# Patient Record
Sex: Female | Born: 1950 | Race: White | Hispanic: No | Marital: Married | Smoking: Never smoker
Health system: Southern US, Community
[De-identification: ages and names within clinical notes are randomized; demographics above are authoritative.]

## PROBLEM LIST (undated history)

## (undated) DIAGNOSIS — F329 Major depressive disorder, single episode, unspecified: Secondary | ICD-10-CM

## (undated) DIAGNOSIS — K219 Gastro-esophageal reflux disease without esophagitis: Secondary | ICD-10-CM

## (undated) DIAGNOSIS — R112 Nausea with vomiting, unspecified: Secondary | ICD-10-CM

## (undated) DIAGNOSIS — Z9889 Other specified postprocedural states: Secondary | ICD-10-CM

## (undated) DIAGNOSIS — K589 Irritable bowel syndrome without diarrhea: Secondary | ICD-10-CM

## (undated) DIAGNOSIS — M1990 Unspecified osteoarthritis, unspecified site: Secondary | ICD-10-CM

## (undated) DIAGNOSIS — R32 Unspecified urinary incontinence: Secondary | ICD-10-CM

## (undated) DIAGNOSIS — R5382 Chronic fatigue, unspecified: Secondary | ICD-10-CM

## (undated) DIAGNOSIS — C679 Malignant neoplasm of bladder, unspecified: Secondary | ICD-10-CM

## (undated) DIAGNOSIS — Q059 Spina bifida, unspecified: Secondary | ICD-10-CM

## (undated) DIAGNOSIS — M797 Fibromyalgia: Secondary | ICD-10-CM

## (undated) DIAGNOSIS — Z8719 Personal history of other diseases of the digestive system: Secondary | ICD-10-CM

## (undated) DIAGNOSIS — E039 Hypothyroidism, unspecified: Secondary | ICD-10-CM

## (undated) DIAGNOSIS — F419 Anxiety disorder, unspecified: Secondary | ICD-10-CM

## (undated) DIAGNOSIS — F32A Depression, unspecified: Secondary | ICD-10-CM

## (undated) HISTORY — PX: EYE SURGERY: SHX253

## (undated) HISTORY — PX: ABDOMINAL HYSTERECTOMY: SHX81

## (undated) HISTORY — PX: FOOT SURGERY: SHX648

## (undated) HISTORY — PX: COLONOSCOPY: SHX174

## (undated) HISTORY — PX: OTHER SURGICAL HISTORY: SHX169

## (undated) HISTORY — PX: CHOLECYSTECTOMY: SHX55

## (undated) HISTORY — PX: ESOPHAGOGASTRODUODENOSCOPY: SHX1529

## (undated) HISTORY — PX: APPENDECTOMY: SHX54

## (undated) HISTORY — PX: ANKLE SURGERY: SHX546

---

## 2000-05-27 ENCOUNTER — Encounter (HOSPITAL_COMMUNITY): Admission: RE | Admit: 2000-05-27 | Discharge: 2000-08-25 | Payer: Self-pay | Admitting: Family Medicine

## 2000-08-02 ENCOUNTER — Encounter: Payer: Self-pay | Admitting: Family Medicine

## 2001-02-16 ENCOUNTER — Emergency Department (HOSPITAL_COMMUNITY): Admission: EM | Admit: 2001-02-16 | Discharge: 2001-02-17 | Payer: Self-pay

## 2001-02-19 ENCOUNTER — Other Ambulatory Visit (HOSPITAL_COMMUNITY): Admission: RE | Admit: 2001-02-19 | Discharge: 2001-03-03 | Payer: Self-pay | Admitting: Psychiatry

## 2011-03-01 ENCOUNTER — Emergency Department (HOSPITAL_COMMUNITY)
Admission: EM | Admit: 2011-03-01 | Discharge: 2011-03-02 | Discharge status: Against Medical Advice | Payer: Medicare Other | Attending: Emergency Medicine | Admitting: Emergency Medicine

## 2011-03-01 DIAGNOSIS — R5381 Other malaise: Secondary | ICD-10-CM | POA: Insufficient documentation

## 2011-03-01 DIAGNOSIS — R42 Dizziness and giddiness: Secondary | ICD-10-CM | POA: Insufficient documentation

## 2014-03-25 ENCOUNTER — Emergency Department (HOSPITAL_COMMUNITY)
Admission: EM | Admit: 2014-03-25 | Discharge: 2014-03-25 | Discharge status: Against Medical Advice | Payer: Medicare Other | Attending: Emergency Medicine | Admitting: Emergency Medicine

## 2014-03-25 ENCOUNTER — Emergency Department (HOSPITAL_COMMUNITY): Payer: Medicare Other

## 2014-03-25 ENCOUNTER — Encounter (HOSPITAL_COMMUNITY): Payer: Self-pay | Admitting: Emergency Medicine

## 2014-03-25 DIAGNOSIS — S99919A Unspecified injury of unspecified ankle, initial encounter: Principal | ICD-10-CM

## 2014-03-25 DIAGNOSIS — Y92009 Unspecified place in unspecified non-institutional (private) residence as the place of occurrence of the external cause: Secondary | ICD-10-CM | POA: Insufficient documentation

## 2014-03-25 DIAGNOSIS — R296 Repeated falls: Secondary | ICD-10-CM | POA: Insufficient documentation

## 2014-03-25 DIAGNOSIS — Y9389 Activity, other specified: Secondary | ICD-10-CM | POA: Insufficient documentation

## 2014-03-25 DIAGNOSIS — S99929A Unspecified injury of unspecified foot, initial encounter: Principal | ICD-10-CM

## 2014-03-25 DIAGNOSIS — S8990XA Unspecified injury of unspecified lower leg, initial encounter: Secondary | ICD-10-CM | POA: Insufficient documentation

## 2014-03-25 HISTORY — DX: Fibromyalgia: M79.7

## 2014-03-25 HISTORY — DX: Chronic fatigue, unspecified: R53.82

## 2014-03-25 LAB — CBC WITH DIFFERENTIAL/PLATELET
Basophils Absolute: 0 10*3/uL (ref 0.0–0.1)
Basophils Relative: 0 % (ref 0–1)
Eosinophils Absolute: 0.2 10*3/uL (ref 0.0–0.7)
Eosinophils Relative: 3 % (ref 0–5)
HCT: 32.8 % — ABNORMAL LOW (ref 36.0–46.0)
Hemoglobin: 10.9 g/dL — ABNORMAL LOW (ref 12.0–15.0)
Lymphocytes Relative: 18 % (ref 12–46)
Lymphs Abs: 1.5 10*3/uL (ref 0.7–4.0)
MCH: 32.9 pg (ref 26.0–34.0)
MCHC: 33.2 g/dL (ref 30.0–36.0)
MCV: 99.1 fL (ref 78.0–100.0)
Monocytes Absolute: 0.7 10*3/uL (ref 0.1–1.0)
Monocytes Relative: 8 % (ref 3–12)
Neutro Abs: 5.9 10*3/uL (ref 1.7–7.7)
Neutrophils Relative %: 71 % (ref 43–77)
Platelets: 286 10*3/uL (ref 150–400)
RBC: 3.31 MIL/uL — ABNORMAL LOW (ref 3.87–5.11)
RDW: 14 % (ref 11.5–15.5)
WBC: 8.3 10*3/uL (ref 4.0–10.5)

## 2014-03-25 LAB — URINALYSIS, ROUTINE W REFLEX MICROSCOPIC
Bilirubin Urine: NEGATIVE
Glucose, UA: NEGATIVE mg/dL
Ketones, ur: NEGATIVE mg/dL
Nitrite: NEGATIVE
Protein, ur: NEGATIVE mg/dL
Specific Gravity, Urine: 1.015 (ref 1.005–1.030)
Urobilinogen, UA: 0.2 mg/dL (ref 0.0–1.0)
pH: 7 (ref 5.0–8.0)

## 2014-03-25 LAB — BASIC METABOLIC PANEL
Anion gap: 14 (ref 5–15)
BUN: 10 mg/dL (ref 6–23)
CO2: 24 mEq/L (ref 19–32)
Calcium: 8.7 mg/dL (ref 8.4–10.5)
Chloride: 90 mEq/L — ABNORMAL LOW (ref 96–112)
Creatinine, Ser: 0.49 mg/dL — ABNORMAL LOW (ref 0.50–1.10)
GFR calc Af Amer: >90 mL/min (ref >90)
GFR calc non Af Amer: >90 mL/min (ref >90)
Glucose, Bld: 114 mg/dL — ABNORMAL HIGH (ref 70–99)
Potassium: 4.8 mEq/L (ref 3.7–5.3)
Sodium: 128 mEq/L — ABNORMAL LOW (ref 137–147)

## 2014-03-25 LAB — URINE MICROSCOPIC-ADD ON

## 2014-03-25 NOTE — ED Notes (Signed)
Called x3 with no response

## 2014-03-25 NOTE — ED Notes (Signed)
Pt c/o wound to right ankle with some redness and swelling noted; pt with wound to bottom of right foot; pt appears sleepy at present; pt sts multiple falls unsure of why she is falling at home

## 2014-03-27 ENCOUNTER — Emergency Department (HOSPITAL_COMMUNITY): Payer: Medicare Other

## 2014-03-27 ENCOUNTER — Encounter (HOSPITAL_COMMUNITY): Payer: Self-pay | Admitting: Emergency Medicine

## 2014-03-27 ENCOUNTER — Inpatient Hospital Stay (HOSPITAL_COMMUNITY)
Admission: EM | Admit: 2014-03-27 | Discharge: 2014-03-28 | DRG: 603 | Disposition: A | Discharge status: Home / Self Care | Payer: Medicare Other | Attending: Internal Medicine | Admitting: Internal Medicine

## 2014-03-27 DIAGNOSIS — L03119 Cellulitis of unspecified part of limb: Principal | ICD-10-CM

## 2014-03-27 DIAGNOSIS — L0390 Cellulitis, unspecified: Secondary | ICD-10-CM | POA: Diagnosis present

## 2014-03-27 DIAGNOSIS — Z881 Allergy status to other antibiotic agents status: Secondary | ICD-10-CM

## 2014-03-27 DIAGNOSIS — M797 Fibromyalgia: Secondary | ICD-10-CM | POA: Diagnosis present

## 2014-03-27 DIAGNOSIS — R5382 Chronic fatigue, unspecified: Secondary | ICD-10-CM

## 2014-03-27 DIAGNOSIS — W19XXXA Unspecified fall, initial encounter: Secondary | ICD-10-CM | POA: Diagnosis present

## 2014-03-27 DIAGNOSIS — IMO0001 Reserved for inherently not codable concepts without codable children: Secondary | ICD-10-CM | POA: Diagnosis present

## 2014-03-27 DIAGNOSIS — Z880 Allergy status to penicillin: Secondary | ICD-10-CM

## 2014-03-27 DIAGNOSIS — Z79899 Other long term (current) drug therapy: Secondary | ICD-10-CM

## 2014-03-27 DIAGNOSIS — L02419 Cutaneous abscess of limb, unspecified: Secondary | ICD-10-CM | POA: Diagnosis not present

## 2014-03-27 DIAGNOSIS — L03115 Cellulitis of right lower limb: Secondary | ICD-10-CM

## 2014-03-27 LAB — BASIC METABOLIC PANEL
Anion gap: 15 (ref 5–15)
BUN: 10 mg/dL (ref 6–23)
CALCIUM: 8.6 mg/dL (ref 8.4–10.5)
CO2: 25 mEq/L (ref 19–32)
Chloride: 90 mEq/L — ABNORMAL LOW (ref 96–112)
Creatinine, Ser: 0.53 mg/dL (ref 0.50–1.10)
GFR calc Af Amer: >90 mL/min (ref >90)
GFR calc non Af Amer: >90 mL/min (ref >90)
GLUCOSE: 99 mg/dL (ref 70–99)
POTASSIUM: 4.7 meq/L (ref 3.7–5.3)
SODIUM: 130 meq/L — AB (ref 137–147)

## 2014-03-27 LAB — CBC
HCT: 31.9 % — ABNORMAL LOW (ref 36.0–46.0)
Hemoglobin: 10.4 g/dL — ABNORMAL LOW (ref 12.0–15.0)
MCH: 32.5 pg (ref 26.0–34.0)
MCHC: 32.6 g/dL (ref 30.0–36.0)
MCV: 99.7 fL (ref 78.0–100.0)
PLATELETS: 297 10*3/uL (ref 150–400)
RBC: 3.2 MIL/uL — AB (ref 3.87–5.11)
RDW: 14.3 % (ref 11.5–15.5)
WBC: 8.5 10*3/uL (ref 4.0–10.5)

## 2014-03-27 LAB — LACTIC ACID, PLASMA: Lactic Acid, Venous: 1.5 mmol/L (ref 0.5–2.2)

## 2014-03-27 MED ORDER — SODIUM CHLORIDE 0.9 % IV BOLUS (SEPSIS)
1000.0000 mL | Freq: Once | INTRAVENOUS | Status: AC
  Administered 2014-03-27: 1000 mL via INTRAVENOUS

## 2014-03-27 MED ORDER — CLINDAMYCIN PHOSPHATE 600 MG/50ML IV SOLN
600.0000 mg | Freq: Once | INTRAVENOUS | Status: AC
  Administered 2014-03-27: 600 mg via INTRAVENOUS
  Filled 2014-03-27: qty 50

## 2014-03-27 MED ORDER — ONDANSETRON HCL 4 MG/2ML IJ SOLN
4.0000 mg | Freq: Once | INTRAMUSCULAR | Status: AC
  Administered 2014-03-27: 4 mg via INTRAVENOUS
  Filled 2014-03-27: qty 2

## 2014-03-27 MED ORDER — MORPHINE SULFATE 4 MG/ML IJ SOLN
6.0000 mg | Freq: Once | INTRAMUSCULAR | Status: AC
  Administered 2014-03-27: 6 mg via INTRAVENOUS
  Filled 2014-03-27: qty 2

## 2014-03-27 NOTE — ED Notes (Signed)
Pt was in facility on Thursday with same complaint, left AMA d/t wait

## 2014-03-27 NOTE — ED Notes (Addendum)
Pt arrives via EMS with leg pain, sore on lateral side of R ankle, pus exudate according to EMS. Red streaking present. Pt states she fell last Friday, hit her head and ankle, states ankle was weeping at time of injury. Pt states ankle has been swollen since. Right leg is warmer to the touch than left leg, blanchable. Full movement. Pain started today 6/10

## 2014-03-27 NOTE — ED Notes (Signed)
MD Kohut at bedside. 

## 2014-03-27 NOTE — ED Provider Notes (Signed)
CSN: [id number]     Arrival date & time 03/27/14  2025 History   First MD Initiated Contact with Patient 03/27/14 2119     Chief Complaint  Patient presents with  . Leg Pain     (Consider location/radiation/quality/duration/timing/severity/associated sxs/prior Treatment) Patient is a 63 y.o. female presenting with leg pain.  Leg Pain Location:  Ankle Injury: yes   Mechanism of injury: fall   Fall:    Entrapped after fall: no   Ankle location:  R ankle Pain details:    Severity:  Moderate   Timing:  Constant Associated symptoms: no back pain     Past Medical History  Diagnosis Date  . Fibromyalgia   . Chronic fatigue    Past Surgical History  Procedure Laterality Date  . Ankle surgery     No family history on file. History  Substance Use Topics  . Smoking status: Never Smoker   . Smokeless tobacco: Not on file  . Alcohol Use: Yes   OB History   Grav Para Term Preterm Abortions TAB SAB Ect Mult Living                 Review of Systems  Constitutional: Negative for activity change.  HENT: Negative for congestion.   Respiratory: Negative for cough and shortness of breath.   Cardiovascular: Negative for chest pain and leg swelling.  Gastrointestinal: Negative for nausea, vomiting, abdominal pain, diarrhea, constipation, blood in stool and abdominal distention.  Genitourinary: Negative for dysuria, flank pain and vaginal discharge.  Musculoskeletal: Negative for back pain.  Skin: Positive for color change and wound.  Neurological: Negative for syncope and headaches.  Psychiatric/Behavioral: Negative for agitation.      Allergies  Penicillins and Sulfa antibiotics  Home Medications   Prior to Admission medications   Medication Sig Start Date End Date Taking? Authorizing Provider  alprazolam Duanne Moron) 2 MG tablet Take 2 mg by mouth 3 (three) times daily. scheduled 03/15/14  Yes Historical Provider, MD  buPROPion (WELLBUTRIN XL) 300 MG 24 hr tablet Take 300 mg  by mouth daily.  03/09/14  Yes Historical Provider, MD  carisoprodol (SOMA) 350 MG tablet Take 350 mg by mouth 3 (three) times daily.   Yes Historical Provider, MD  cephALEXin (KEFLEX) 500 MG capsule Take 500 mg by mouth 3 (three) times daily. 7 day course started 03/27/14 03/26/14  Yes Historical Provider, MD  estradiol (ESTRACE) 1 MG tablet Take 1 mg by mouth daily.  03/09/14  Yes Historical Provider, MD  FLUoxetine (PROZAC) 40 MG capsule Take 40 mg by mouth daily.  03/09/14  Yes Historical Provider, MD  gabapentin (NEURONTIN) 600 MG tablet Take 600 mg by mouth 3 (three) times daily.  03/09/14  Yes Historical Provider, MD  levothyroxine (SYNTHROID, LEVOTHROID) 50 MCG tablet Take 50 mcg by mouth daily.  03/09/14  Yes Historical Provider, MD  Multiple Vitamin (MULTIVITAMIN WITH MINERALS) TABS tablet Take 1 tablet by mouth daily.   Yes Historical Provider, MD  omeprazole (PRILOSEC) 40 MG capsule Take 40 mg by mouth 2 (two) times daily.  03/09/14  Yes Historical Provider, MD  Oxycodone HCl 10 MG TABS Take 10 mg by mouth 3 (three) times daily. scheduled 03/20/14  Yes Historical Provider, MD   BP 133/74  Pulse 91  Temp(Src) 98 F (36.7 C) (Oral)  Resp 16  SpO2 97% Physical Exam  Constitutional: She is oriented to person, place, and time. She appears well-developed.  HENT:  Head: Normocephalic.  Eyes: Pupils are equal,  round, and reactive to light.  Neck: Neck supple.  Cardiovascular: Normal rate.  Exam reveals no gallop and no friction rub.   No murmur heard. Pulmonary/Chest: Effort normal and breath sounds normal. No respiratory distress.  Abdominal: Soft. She exhibits no distension. There is no tenderness. There is no rebound.  Musculoskeletal: She exhibits no edema.  Wound to left foot, cellulitis noted on full calf   Neurological: She is alert and oriented to person, place, and time.  Skin: Skin is warm.  Psychiatric: She has a normal mood and affect.    ED Course  Procedures (including  critical care time) Labs Review Labs Reviewed - No data to display  Imaging Review Dg Ankle Complete Right  03/27/2014   CLINICAL DATA:  Fall 7 days ago  EXAM: RIGHT ANKLE - COMPLETE 3+ VIEW  COMPARISON:  None.  FINDINGS: There is no evidence of fracture, dislocation, or joint effusion. There is no evidence of arthropathy or other focal bone abnormality. Soft tissue swelling over the right lateral malleolus with a soft tissue laceration.  IMPRESSION: Soft tissue swelling over the right lateral malleolus with a soft tissue laceration. No acute osseous abnormality of the right ankle.   Electronically Signed   By: Kathreen Devoid   On: 03/27/2014 21:58     EKG Interpretation None      MDM   Final diagnoses:  None   63 year old female that presents with cellulitis of right leg. Patient did have a minor trauma which is suspected that she sustained infection that has now spread. Patient has tried outpatient management and was prescribed Keflex. Patient states that the infection has worsened since taking the Keflex. On physical exam the patient is noted to have cellulitis but pulse motor sensation is intact. Patient is able to have passive range of motion without significant pain. Unlikely septic joint. Patient does not show signs of systemic infection no fevers nausea vomiting. Patient is demonstrating that she is feeling outpatient management. Patient started on IV clindamycin in the department and admitted to Dr. Posey Pronto with [facility] for further management.    Claudean Severance, MD 03/29/14 [phone]

## 2014-03-28 DIAGNOSIS — L03119 Cellulitis of unspecified part of limb: Principal | ICD-10-CM

## 2014-03-28 DIAGNOSIS — Z79899 Other long term (current) drug therapy: Secondary | ICD-10-CM | POA: Diagnosis not present

## 2014-03-28 DIAGNOSIS — Z880 Allergy status to penicillin: Secondary | ICD-10-CM | POA: Diagnosis not present

## 2014-03-28 DIAGNOSIS — R5383 Other fatigue: Secondary | ICD-10-CM

## 2014-03-28 DIAGNOSIS — IMO0001 Reserved for inherently not codable concepts without codable children: Secondary | ICD-10-CM | POA: Diagnosis present

## 2014-03-28 DIAGNOSIS — L02419 Cutaneous abscess of limb, unspecified: Secondary | ICD-10-CM | POA: Diagnosis present

## 2014-03-28 DIAGNOSIS — M797 Fibromyalgia: Secondary | ICD-10-CM | POA: Diagnosis present

## 2014-03-28 DIAGNOSIS — W19XXXA Unspecified fall, initial encounter: Secondary | ICD-10-CM | POA: Diagnosis present

## 2014-03-28 DIAGNOSIS — M7989 Other specified soft tissue disorders: Secondary | ICD-10-CM

## 2014-03-28 DIAGNOSIS — R5381 Other malaise: Secondary | ICD-10-CM

## 2014-03-28 DIAGNOSIS — R5382 Chronic fatigue, unspecified: Secondary | ICD-10-CM | POA: Diagnosis present

## 2014-03-28 DIAGNOSIS — L0390 Cellulitis, unspecified: Secondary | ICD-10-CM | POA: Diagnosis present

## 2014-03-28 DIAGNOSIS — M79609 Pain in unspecified limb: Secondary | ICD-10-CM

## 2014-03-28 DIAGNOSIS — Z881 Allergy status to other antibiotic agents status: Secondary | ICD-10-CM | POA: Diagnosis not present

## 2014-03-28 LAB — COMPREHENSIVE METABOLIC PANEL
ALBUMIN: 3 g/dL — AB (ref 3.5–5.2)
ALT: 35 U/L (ref 0–35)
ANION GAP: 15 (ref 5–15)
AST: 58 U/L — ABNORMAL HIGH (ref 0–37)
Alkaline Phosphatase: 89 U/L (ref 39–117)
BUN: 8 mg/dL (ref 6–23)
CHLORIDE: 96 meq/L (ref 96–112)
CO2: 21 mEq/L (ref 19–32)
CREATININE: 0.51 mg/dL (ref 0.50–1.10)
Calcium: 8.1 mg/dL — ABNORMAL LOW (ref 8.4–10.5)
GFR calc Af Amer: >90 mL/min (ref >90)
Glucose, Bld: 87 mg/dL (ref 70–99)
Potassium: 4.4 mEq/L (ref 3.7–5.3)
Sodium: 132 mEq/L — ABNORMAL LOW (ref 137–147)
Total Bilirubin: <0.2 mg/dL — ABNORMAL LOW (ref 0.3–1.2)
Total Protein: 6.2 g/dL (ref 6.0–8.3)

## 2014-03-28 LAB — C-REACTIVE PROTEIN: CRP: 2.2 mg/dL — ABNORMAL HIGH (ref <0.60)

## 2014-03-28 LAB — CBC
HEMATOCRIT: 31.1 % — AB (ref 36.0–46.0)
Hemoglobin: 10.2 g/dL — ABNORMAL LOW (ref 12.0–15.0)
MCH: 33 pg (ref 26.0–34.0)
MCHC: 32.8 g/dL (ref 30.0–36.0)
MCV: 100.6 fL — ABNORMAL HIGH (ref 78.0–100.0)
PLATELETS: 238 10*3/uL (ref 150–400)
RBC: 3.09 MIL/uL — ABNORMAL LOW (ref 3.87–5.11)
RDW: 14.5 % (ref 11.5–15.5)
WBC: 6.5 10*3/uL (ref 4.0–10.5)

## 2014-03-28 LAB — PROTIME-INR
INR: 0.99 (ref 0.00–1.49)
PROTHROMBIN TIME: 13.1 s (ref 11.6–15.2)

## 2014-03-28 LAB — SEDIMENTATION RATE: SED RATE: 37 mm/h — AB (ref 0–22)

## 2014-03-28 MED ORDER — ONDANSETRON HCL 4 MG PO TABS: 4.0000 mg | ORAL_TABLET | Freq: Four times a day (QID) | ORAL | Status: DC | PRN

## 2014-03-28 MED ORDER — ENOXAPARIN SODIUM 40 MG/0.4ML ~~LOC~~ SOLN
40.0000 mg | SUBCUTANEOUS | Status: DC
  Administered 2014-03-28: 40 mg via SUBCUTANEOUS
  Filled 2014-03-28: qty 0.4

## 2014-03-28 MED ORDER — ALPRAZOLAM 0.5 MG PO TABS
2.0000 mg | ORAL_TABLET | Freq: Three times a day (TID) | ORAL | Status: DC | PRN
  Administered 2014-03-28: 2 mg via ORAL
  Filled 2014-03-28: qty 4

## 2014-03-28 MED ORDER — ONDANSETRON HCL 4 MG/2ML IJ SOLN: 4.0000 mg | Freq: Four times a day (QID) | INTRAMUSCULAR | Status: DC | PRN

## 2014-03-28 MED ORDER — ACETAMINOPHEN 650 MG RE SUPP
650.0000 mg | Freq: Four times a day (QID) | RECTAL | Status: DC | PRN
Start: 2014-03-28 — End: 2014-03-28

## 2014-03-28 MED ORDER — CLINDAMYCIN PHOSPHATE 600 MG/50ML IV SOLN
600.0000 mg | Freq: Three times a day (TID) | INTRAVENOUS | Status: DC
  Administered 2014-03-28: 600 mg via INTRAVENOUS
  Filled 2014-03-28 (×2): qty 50

## 2014-03-28 MED ORDER — SODIUM CHLORIDE 0.9 % IV SOLN
INTRAVENOUS | Status: DC
  Administered 2014-03-28: 04:00:00 via INTRAVENOUS

## 2014-03-28 MED ORDER — VANCOMYCIN HCL IN DEXTROSE 1-5 GM/200ML-% IV SOLN
1000.0000 mg | Freq: Two times a day (BID) | INTRAVENOUS | Status: DC
  Filled 2014-03-28 (×2): qty 200

## 2014-03-28 MED ORDER — DOXYCYCLINE HYCLATE 100 MG PO CAPS: 100.0000 mg | ORAL_CAPSULE | Freq: Two times a day (BID) | ORAL | Status: DC

## 2014-03-28 MED ORDER — FLUOXETINE HCL 20 MG PO CAPS
40.0000 mg | ORAL_CAPSULE | Freq: Every day | ORAL | Status: DC
  Administered 2014-03-28: 40 mg via ORAL
  Filled 2014-03-28: qty 2

## 2014-03-28 MED ORDER — LEVOTHYROXINE SODIUM 50 MCG PO TABS
50.0000 ug | ORAL_TABLET | Freq: Every day | ORAL | Status: DC
  Administered 2014-03-28: 50 ug via ORAL
  Filled 2014-03-28 (×2): qty 1

## 2014-03-28 MED ORDER — GABAPENTIN 600 MG PO TABS
600.0000 mg | ORAL_TABLET | Freq: Three times a day (TID) | ORAL | Status: DC
  Administered 2014-03-28 (×2): 600 mg via ORAL
  Filled 2014-03-28 (×3): qty 1

## 2014-03-28 MED ORDER — OXYCODONE HCL 5 MG PO TABS
10.0000 mg | ORAL_TABLET | Freq: Three times a day (TID) | ORAL | Status: DC | PRN
  Administered 2014-03-28 (×2): 10 mg via ORAL
  Filled 2014-03-28 (×3): qty 2

## 2014-03-28 MED ORDER — BUPROPION HCL ER (XL) 300 MG PO TB24
300.0000 mg | ORAL_TABLET | Freq: Every day | ORAL | Status: DC
  Administered 2014-03-28: 300 mg via ORAL
  Filled 2014-03-28: qty 1

## 2014-03-28 MED ORDER — PANTOPRAZOLE SODIUM 40 MG PO TBEC
40.0000 mg | DELAYED_RELEASE_TABLET | Freq: Every day | ORAL | Status: DC
  Filled 2014-03-28: qty 1

## 2014-03-28 MED ORDER — ACETAMINOPHEN 325 MG PO TABS: 650.0000 mg | ORAL_TABLET | Freq: Four times a day (QID) | ORAL | Status: DC | PRN

## 2014-03-28 MED ORDER — CARISOPRODOL 350 MG PO TABS
350.0000 mg | ORAL_TABLET | Freq: Three times a day (TID) | ORAL | Status: DC
  Administered 2014-03-28 (×2): 350 mg via ORAL
  Filled 2014-03-28 (×2): qty 1

## 2014-03-28 MED ORDER — VANCOMYCIN HCL IN DEXTROSE 1-5 GM/200ML-% IV SOLN
1000.0000 mg | Freq: Once | INTRAVENOUS | Status: AC
  Administered 2014-03-28: 1000 mg via INTRAVENOUS
  Filled 2014-03-28: qty 200

## 2014-03-28 NOTE — Discharge Instructions (Signed)
Follow with Primary MD FRIED, ROBERT L, MD in 4 days   Get CBC, CMP, 2 view Chest X ray checked  by Primary MD next visit.    Activity: As tolerated with Full fall precautions use walker/cane & assistance as needed   Disposition Home     Diet: Heart Healthy   For Heart failure patients - Check your Weight same time everyday, if you gain over 2 pounds, or you develop in leg swelling, experience more shortness of breath or chest pain, call your Primary MD immediately. Follow Cardiac Low Salt Diet and 1.8 lit/day fluid restriction.   On your next visit with her primary care physician please Get Medicines reviewed and adjusted.  Please request your Prim.MD to go over all Hospital Tests and Procedure/Radiological results at the follow up, please get all Hospital records sent to your Prim MD by signing hospital release before you go home.   If you experience worsening of your admission symptoms, develop shortness of breath, life threatening emergency, suicidal or homicidal thoughts you must seek medical attention immediately by calling 911 or calling your MD immediately  if symptoms less severe.  You Must read complete instructions/literature along with all the possible adverse reactions/side effects for all the Medicines you take and that have been prescribed to you. Take any new Medicines after you have completely understood and accpet all the possible adverse reactions/side effects.   Do not drive, operating heavy machinery, perform activities at heights, swimming or participation in water activities or provide baby sitting services if your were admitted for syncope or siezures until you have seen by Primary MD or a Neurologist and advised to do so again.  Do not drive when taking Pain medications.    Do not take more than prescribed Pain, Sleep and Anxiety Medications  Special Instructions: If you have smoked or chewed Tobacco  in the last 2 yrs please stop smoking, stop any regular  Alcohol  and or any Recreational drug use.  Wear Seat belts while driving.   Please note  You were cared for by a hospitalist during your hospital stay. If you have any questions about your discharge medications or the care you received while you were in the hospital after you are discharged, you can call the unit and asked to speak with the hospitalist on call if the hospitalist that took care of you is not available. Once you are discharged, your primary care physician will handle any further medical issues. Please note that NO REFILLS for any discharge medications will be authorized once you are discharged, as it is imperative that you return to your primary care physician (or establish a relationship with a primary care physician if you do not have one) for your aftercare needs so that they can reassess your need for medications and monitor your lab values.

## 2014-03-28 NOTE — Progress Notes (Signed)
Utilization Review Completed.Edu On T7/19/2015  

## 2014-03-28 NOTE — H&P (Signed)
[facility] History and Physical  Patient: Natalie Hampton  KNL:[id number]  DOB: February 20, 1951  DOS: the patient was seen and examined on 03/28/2014 PCP: Abigail Miyamoto, MD  Chief Complaint: Right leg pain and swelling  HPI: [name] is a 63 y.o. female with Past medical history of fibromyalgia and chronic fatigue. Patient presented with complaints of right leg pain and swelling. She mentions that she had a fall 2 days ago where she tripped over something, after which she sustained some injury and came to the ED on 03/25/2014 but left without seeing. She call her PCP who gave her Keflex that she took 2 tablets. But she continues to have further worsening of swelling redness pain on her right leg and decided to come to the ED again. She mentions she had some pus coming out from the site initially on the right lateral malleolus. 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 Last surgery was 7-8 years ago at [facility] by an orthopedic. She denies any other recent change in her medications. She mentions after the fall she had hit her head but no bleeding no neck pain no focal deficit no blurring of vision.  The patient is coming from home. And at her baseline independent for most of her ADL.  Review of Systems: as mentioned in the history of present illness.  A Comprehensive review of the other systems is negative.  Past Medical History  Diagnosis Date  . Fibromyalgia   . Chronic fatigue    Past Surgical History  Procedure Laterality Date  . Ankle surgery     Social History:  reports that she has never smoked. She does not have any smokeless tobacco history on file. She reports that she drinks alcohol. She reports that she does not use illicit drugs.  Allergies  Allergen Reactions  . Penicillins Anaphylaxis  . Sulfa Antibiotics Hives    No family history on file.  Prior to Admission  medications   Medication Sig Start Date End Date Taking? Authorizing Provider  alprazolam Duanne Moron) 2 MG tablet Take 2 mg by mouth 3 (three) times daily. scheduled 03/15/14  Yes Historical Provider, MD  buPROPion (WELLBUTRIN XL) 300 MG 24 hr tablet Take 300 mg by mouth daily.  03/09/14  Yes Historical Provider, MD  carisoprodol (SOMA) 350 MG tablet Take 350 mg by mouth 3 (three) times daily.   Yes Historical Provider, MD  cephALEXin (KEFLEX) 500 MG capsule Take 500 mg by mouth 3 (three) times daily. 7 day course started 03/27/14 03/26/14  Yes Historical Provider, MD  estradiol (ESTRACE) 1 MG tablet Take 1 mg by mouth daily.  03/09/14  Yes Historical Provider, MD  FLUoxetine (PROZAC) 40 MG capsule Take 40 mg by mouth daily.  03/09/14  Yes Historical Provider, MD  gabapentin (NEURONTIN) 600 MG tablet Take 600 mg by mouth 3 (three) times daily.  03/09/14  Yes Historical Provider, MD  levothyroxine (SYNTHROID, LEVOTHROID) 50 MCG tablet Take 50 mcg by mouth daily.  03/09/14  Yes Historical Provider, MD  Multiple Vitamin (MULTIVITAMIN WITH MINERALS) TABS tablet Take 1 tablet by mouth daily.   Yes Historical Provider, MD  omeprazole (PRILOSEC) 40 MG capsule Take 40 mg by mouth 2 (two) times daily.  03/09/14  Yes Historical Provider, MD  Oxycodone HCl 10 MG TABS Take 10 mg by mouth 3 (three) times daily. scheduled 03/20/14  Yes Historical Provider, MD  Physical Exam: Filed Vitals:   03/28/14 0000 03/28/14 0100 03/28/14 0105 03/28/14 0130  BP: 119/66 120/75 120/75 130/70  Pulse: 86 91 86 92  Temp:      TempSrc:      Resp: _0 SpO2: 95% 96% 97% 97%    General: Alert, Awake and Oriented to Time, Place and Person. Appear in mild distress Eyes: PERRL ENT: Oral Mucosa clear moist. Neck: no JVD Cardiovascular: S1 and S2 Present, no Murmur, Peripheral Pulses Present Respiratory: Bilateral Air entry equal and Decreased, Clear to Auscultation, noCrackles, no wheezes Abdomen: Bowel Sound Present, Soft and  noNon tender Skin: no Rash Extremities: Right more than left pedal edema,  right calf tenderness,  Presence of ulcer on the right plantar surface 1 cm area with yellow base, Redness or the right leg Right lateral malleolus ulcer 1.5 cm with yellow base  Neurologic: Grossly no focal neuro deficit.  Labs on Admission:  CBC:  Recent Labs Lab 03/25/14 1606 03/27/14 2233  WBC 8.3 8.5  NEUTROABS 5.9  --   HGB 10.9* 10.4*  HCT 32.8* 31.9*  MCV 99.1 99.7  PLT 286 297    CMP     Component Value Date/Time   NA 130* 03/27/2014 2233   K 4.7 03/27/2014 2233   CL 90* 03/27/2014 2233   CO2 25 03/27/2014 2233   GLUCOSE 99 03/27/2014 2233   BUN 10 03/27/2014 2233   CREATININE 0.53 03/27/2014 2233   CALCIUM 8.6 03/27/2014 2233   GFRNONAA >90 03/27/2014 2233   GFRAA >90 03/27/2014 2233    No results found for this basename: LIPASE, AMYLASE,  in the last 168 hours No results found for this basename: AMMONIA,  in the last 168 hours  No results found for this basename: CKTOTAL, CKMB, CKMBINDEX, TROPONINI,  in the last 168 hours BNP (last 3 results) No results found for this basename: PROBNP,  in the last 8760 hours  Radiological Exams on Admission: Dg Ankle Complete Right  03/27/2014   CLINICAL DATA:  Fall 7 days ago  EXAM: RIGHT ANKLE - COMPLETE 3+ VIEW  COMPARISON:  None.  FINDINGS: There is no evidence of fracture, dislocation, or joint effusion. There is no evidence of arthropathy or other focal bone abnormality. Soft tissue swelling over the right lateral malleolus with a soft tissue laceration.  IMPRESSION: Soft tissue swelling over the right lateral malleolus with a soft tissue laceration. No acute osseous abnormality of the right ankle.   Electronically Signed   By: Kathreen Devoid   On: 03/27/2014 21:58   Assessment/Plan Principal Problem:   Cellulitis Active Problems:   Fibromyalgia   Chronic fatigue   1. Cellulitis Patient presents with complains of cellulitis. She has progressively  worsening disease despite being on antibiotics for 24 hours but Currently she is admitted on med surge. Continue her on IV fluids, IV clindamycin. Check ESR CRP. She may need wound care consult.  2. Right lower leg edema. Patient had complaint of leg tenderness after her infection. Currently I will obtain lower extremity Doppler to rule out any DVT.  3. Fibromyalgia Chronic fatigue Continue home medications.  DVT Prophylaxis: subcutaneous Heparin Nutrition: Regular diet  Code Status: Full  Disposition: Admitted to observation in med-surge unit.  Author: Berle Mull, MD [facility] Pager: [phone] 03/28/2014, 3:13 AM    If 7PM-7AM, please contact night-coverage [email or web address] Password [email or web address]  **Disclaimer: This note may have been dictated with voice recognition software. Similar sounding words can inadvertently be  transcribed and this note may contain transcription errors which may not have been corrected upon publication of note.**

## 2014-03-28 NOTE — Discharge Summary (Signed)
Natalie Hampton, is a 63 y.o. female  DOB 26-Nov-1950  MRN [id number].  Admission date:  03/27/2014  Admitting Physician  Berle Mull, MD  Discharge Date:  03/28/2014   Primary MD  Abigail Miyamoto, MD  Recommendations for primary care physician for things to follow:   Follow L.Leg cellulitis closely   Admission Diagnosis  Ankle Infection   Discharge Diagnosis  L Leg cellulitis  Principal Problem:   Cellulitis Active Problems:   Fibromyalgia   Chronic fatigue      Past Medical History  Diagnosis Date  . Fibromyalgia   . Chronic fatigue     Past Surgical History  Procedure Laterality Date  . Ankle surgery         History of present illness and  Hospital Course:     Kindly see H&P for history of present illness and admission details, please review complete Labs, Consult reports and Test reports for all details in brief  HPI  from the history and physical done on the day of admission  Natalie Hampton is a 63 y.o. female with Past medical history of fibromyalgia and chronic fatigue.  Patient presented with complaints of right leg pain and swelling. She mentions that she had a fall 2 days ago where she tripped over something, after which she sustained some injury and came to the ED on 03/25/2014 but left without seeing.  She call her PCP who gave her Keflex that she took 2 tablets. But she continues to have further worsening of swelling redness pain on her right leg and decided to come to the ED again.  She mentions she had some pus coming out from the site initially on the right lateral malleolus.  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 Last surgery was 7-8 years ago at [facility] by an orthopedic.  She denies any other recent  change in her medications.  She mentions after the fall she had hit her head but no bleeding no neck pain no focal deficit no blurring of vision.   Hospital Course    R Leg minimal and rapidly improving cellulitis, responded well to 1 dose Iv Vanco, X Ray R Ankle and Venous US R Leg stable, afebrile, WBC NML, wants to go NOW , refuses to stay till the AM, was admitted early this am, wants to follow with PCP, place on Doxy x 1 week, follow with PCP.   No changes in Home meds for her chronic medical issues.   Please see H&P for all details.     Discharge Condition: stable   Follow UP  Follow-up Information   Follow up with FRIED, ROBERT L, MD. Schedule an appointment as soon as possible for a visit in 3 days.   Specialty:  Family Medicine   Contact information:   [place] [address] [phone]         Discharge Instructions  and  Discharge Medications      Discharge Instructions   Diet - low sodium heart healthy    Complete by:  As directed      Discharge instructions    Complete by:  As directed   Follow with Primary MD FRIED, ROBERT L, MD in 4 days   Get CBC, CMP, 2 view Chest X ray checked  by Primary MD next visit.    Activity: As tolerated with Full fall precautions use walker/cane & assistance as needed   Disposition Home     Diet: Heart Healthy   For Heart failure patients - Check your Weight same time everyday, if you gain over 2 pounds, or you develop in leg swelling, experience more shortness of breath or chest pain, call your Primary MD immediately. Follow Cardiac Low Salt Diet and 1.8 lit/day fluid restriction.   On your next visit with her primary care physician please Get Medicines reviewed and adjusted.  Please request your Prim.MD to go over all Hospital Tests and Procedure/Radiological results at the follow up, please get all Hospital records sent to your Prim MD by signing hospital release before you go home.   If you  experience worsening of your admission symptoms, develop shortness of breath, life threatening emergency, suicidal or homicidal thoughts you must seek medical attention immediately by calling 911 or calling your MD immediately  if symptoms less severe.  You Must read complete instructions/literature along with all the possible adverse reactions/side effects for all the Medicines you take and that have been prescribed to you. Take any new Medicines after you have completely understood and accpet all the possible adverse reactions/side effects.   Do not drive, operating heavy machinery, perform activities at heights, swimming or participation in water activities or provide baby sitting services if your were admitted for syncope or siezures until you have seen by Primary MD or a Neurologist and advised to do so again.  Do not drive when taking Pain medications.    Do not take more than prescribed Pain, Sleep and Anxiety Medications  Special Instructions: If you have smoked or chewed Tobacco  in the last 2 yrs please stop smoking, stop any regular Alcohol  and or any Recreational drug use.  Wear Seat belts while driving.   Please note  You were cared for by a hospitalist during your hospital stay. If you have any questions about your discharge medications or the care you received while you were in the hospital after you are discharged, you can call the unit and asked to speak with the hospitalist on call if the hospitalist that took care of you is not available. Once you are discharged, your primary care physician will handle any further medical issues. Please note that NO REFILLS for any discharge medications will be authorized once you are discharged, as it is imperative that you return to your primary care physician (or establish a relationship with a primary care physician if you do not have one) for your aftercare needs so that they can reassess your need for medications and monitor your lab  values.  Follow with Primary MD FRIED, ROBERT L, MD in 4 days   Get CBC, CMP, 2 view Chest X ray checked  by Primary MD next visit.    Activity: As tolerated with Full fall precautions use walker/cane & assistance as needed   Disposition Home     Diet: Heart Healthy   For Heart failure patients - Check your Weight same time everyday,  if you gain over 2 pounds, or you develop in leg swelling, experience more shortness of breath or chest pain, call your Primary MD immediately. Follow Cardiac Low Salt Diet and 1.8 lit/day fluid restriction.   On your next visit with her primary care physician please Get Medicines reviewed and adjusted.  Please request your Prim.MD to go over all Hospital Tests and Procedure/Radiological results at the follow up, please get all Hospital records sent to your Prim MD by signing hospital release before you go home.   If you experience worsening of your admission symptoms, develop shortness of breath, life threatening emergency, suicidal or homicidal thoughts you must seek medical attention immediately by calling 911 or calling your MD immediately  if symptoms less severe.  You Must read complete instructions/literature along with all the possible adverse reactions/side effects for all the Medicines you take and that have been prescribed to you. Take any new Medicines after you have completely understood and accpet all the possible adverse reactions/side effects.   Do not drive, operating heavy machinery, perform activities at heights, swimming or participation in water activities or provide baby sitting services if your were admitted for syncope or siezures until you have seen by Primary MD or a Neurologist and advised to do so again.  Do not drive when taking Pain medications.    Do not take more than prescribed Pain, Sleep and Anxiety Medications  Special Instructions: If you have smoked or chewed Tobacco  in the last 2 yrs please stop smoking, stop any  regular Alcohol  and or any Recreational drug use.  Wear Seat belts while driving.   Please note  You were cared for by a hospitalist during your hospital stay. If you have any questions about your discharge medications or the care you received while you were in the hospital after you are discharged, you can call the unit and asked to speak with the hospitalist on call if the hospitalist that took care of you is not available. Once you are discharged, your primary care physician will handle any further medical issues. Please note that NO REFILLS for any discharge medications will be authorized once you are discharged, as it is imperative that you return to your primary care physician (or establish a relationship with a primary care physician if you do not have one) for your aftercare needs so that they can reassess your need for medications and monitor your lab values.     Increase activity slowly    Complete by:  As directed             Medication List    STOP taking these medications       cephALEXin 500 MG capsule  Commonly known as:  KEFLEX      TAKE these medications       alprazolam 2 MG tablet  Commonly known as:  XANAX  Take 2 mg by mouth 3 (three) times daily. scheduled     buPROPion 300 MG 24 hr tablet  Commonly known as:  WELLBUTRIN XL  Take 300 mg by mouth daily.     carisoprodol 350 MG tablet  Commonly known as:  SOMA  Take 350 mg by mouth 3 (three) times daily.     doxycycline 100 MG capsule  Commonly known as:  VIBRAMYCIN  Take 1 capsule (100 mg total) by mouth 2 (two) times daily.     estradiol 1 MG tablet  Commonly known as:  ESTRACE  Take 1 mg by mouth  daily.     FLUoxetine 40 MG capsule  Commonly known as:  PROZAC  Take 40 mg by mouth daily.     gabapentin 600 MG tablet  Commonly known as:  NEURONTIN  Take 600 mg by mouth 3 (three) times daily.     levothyroxine 50 MCG tablet  Commonly known as:  SYNTHROID, LEVOTHROID  Take 50 mcg by mouth  daily.     multivitamin with minerals Tabs tablet  Take 1 tablet by mouth daily.     omeprazole 40 MG capsule  Commonly known as:  PRILOSEC  Take 40 mg by mouth 2 (two) times daily.     Oxycodone HCl 10 MG Tabs  Take 10 mg by mouth 3 (three) times daily. scheduled          Diet and Activity recommendation: See Discharge Instructions above   Consults obtained -     Major procedures and Radiology Reports - PLEASE review detailed and final reports for all details, in brief -       Dg Ankle Complete Right  03/27/2014   CLINICAL DATA:  Fall 7 days ago  EXAM: RIGHT ANKLE - COMPLETE 3+ VIEW  COMPARISON:  None.  FINDINGS: There is no evidence of fracture, dislocation, or joint effusion. There is no evidence of arthropathy or other focal bone abnormality. Soft tissue swelling over the right lateral malleolus with a soft tissue laceration.  IMPRESSION: Soft tissue swelling over the right lateral malleolus with a soft tissue laceration. No acute osseous abnormality of the right ankle.   Electronically Signed   By: Kathreen Devoid   On: 03/27/2014 21:58   Dg Ankle Complete Right  03/25/2014   CLINICAL DATA:  [place] on 03/20/2014 twisting RIGHT ankle, RIGHT ankle pain and swelling  EXAM: RIGHT ANKLE - COMPLETE 3+ VIEW  COMPARISON:  None  FINDINGS: Diffuse soft tissue swelling.  Bones appear demineralized.  Ankle mortise intact.  Prior first metatarsal ORIF and suspect first MTP joint fusion.  No acute fracture, dislocation, or bone destruction.  IMPRESSION: No acute osseous abnormalities.   Electronically Signed   By: Lavonia Dana M.D.   On: 03/25/2014 17:24    Micro Results      No results found for this or any previous visit (from the past 240 hour(s)).     Today   Subjective:   Natalie Hampton today has no headache,no chest abdominal pain,no new weakness tingling or numbness, feels much better wants to go home today.   Objective:   Blood pressure 128/72, pulse 96, temperature 98.1 F  (36.7 C), temperature source Oral, resp. rate 18, weight 87.227 kg (192 lb 4.8 oz), SpO2 97.00%.   Intake/Output Summary (Last 24 hours) at 03/28/14 1706 Last data filed at 03/28/14 1230  Gross per 24 hour  Intake   1770 ml  Output      0 ml  Net   1770 ml    Exam Awake Alert, Oriented x 3, No new F.N deficits, Normal affect Power.AT,PERRAL Supple Neck,No JVD, No cervical lymphadenopathy appriciated.  Symmetrical Chest wall movement, Good air movement bilaterally, CTAB RRR,No Gallops,Rubs or new Murmurs, No Parasternal Heave +ve B.Sounds, Abd Soft, Non tender, No organomegaly appriciated, No rebound -guarding or rigidity. No Cyanosis, Clubbing or edema, No new Rash or bruise, R leg minimal cellulits upto mid leg, small healing scab over medial malleolus, ankle good ROM, minimal edema  Data Review   CBC w Diff: Lab Results  Component Value Date   WBC  6.5 03/28/2014   HGB 10.2* 03/28/2014   HCT 31.1* 03/28/2014   PLT 238 03/28/2014   LYMPHOPCT 18 03/25/2014   MONOPCT 8 03/25/2014   EOSPCT 3 03/25/2014   BASOPCT 0 03/25/2014    CMP: Lab Results  Component Value Date   NA 132* 03/28/2014   K 4.4 03/28/2014   CL 96 03/28/2014   CO2 21 03/28/2014   BUN 8 03/28/2014   CREATININE 0.51 03/28/2014   PROT 6.2 03/28/2014   ALBUMIN 3.0* 03/28/2014   BILITOT <0.2* 03/28/2014   ALKPHOS 89 03/28/2014   AST 58* 03/28/2014   ALT 35 03/28/2014  .   Total Time in preparing paper work, data evaluation and todays exam - 35 minutes  Thurnell Lose M.D on 03/28/2014 at 5:06 PM  [facility] Office  [phone]   **Disclaimer: This note may have been dictated with voice recognition software. Similar sounding words can inadvertently be transcribed and this note may contain transcription errors which may not have been corrected upon publication of note.**

## 2014-03-28 NOTE — Progress Notes (Signed)
VASCULAR LAB PRELIMINARY  PRELIMINARY  PRELIMINARY  PRELIMINARY  Right lower extremity venous Doppler completed.    Preliminary report:  There is no DVT or SVT noted in the right lower extremity.   Aliou Mealey, RVT 03/28/2014, 10:40 AM

## 2014-03-28 NOTE — Progress Notes (Signed)
ANTIBIOTIC CONSULT NOTE - INITIAL  Pharmacy Consult for vancomycin Indication: cellulitis  Allergies  Allergen Reactions  . Penicillins Anaphylaxis  . Sulfa Antibiotics Hives    Patient Measurements: Weight: 192 lb 4.8 oz (87.227 kg)  Vital Signs: Temp: 97.5 F (36.4 C) (07/19 0534) Temp src: Oral (07/19 0534) BP: 102/67 mmHg (07/19 0534) Pulse Rate: 87 (07/19 0534)  Labs:  Recent Labs  03/25/14 1606 03/27/14 2233 03/28/14 0508  WBC 8.3 8.5 6.5  HGB 10.9* 10.4* 10.2*  PLT 286 297 238  CREATININE 0.49* 0.53 0.51   Medical History: Past Medical History  Diagnosis Date  . Fibromyalgia   . Chronic fatigue     Medications:  Prescriptions prior to admission  Medication Sig Dispense Refill  . alprazolam (XANAX) 2 MG tablet Take 2 mg by mouth 3 (three) times daily. scheduled      . buPROPion (WELLBUTRIN XL) 300 MG 24 hr tablet Take 300 mg by mouth daily.       . carisoprodol (SOMA) 350 MG tablet Take 350 mg by mouth 3 (three) times daily.      . cephALEXin (KEFLEX) 500 MG capsule Take 500 mg by mouth 3 (three) times daily. 7 day course started 03/27/14      . estradiol (ESTRACE) 1 MG tablet Take 1 mg by mouth daily.       Marland Kitchen FLUoxetine (PROZAC) 40 MG capsule Take 40 mg by mouth daily.       Marland Kitchen gabapentin (NEURONTIN) 600 MG tablet Take 600 mg by mouth 3 (three) times daily.       Marland Kitchen levothyroxine (SYNTHROID, LEVOTHROID) 50 MCG tablet Take 50 mcg by mouth daily.       . Multiple Vitamin (MULTIVITAMIN WITH MINERALS) TABS tablet Take 1 tablet by mouth daily.      Marland Kitchen omeprazole (PRILOSEC) 40 MG capsule Take 40 mg by mouth 2 (two) times daily.       . Oxycodone HCl 10 MG TABS Take 10 mg by mouth 3 (three) times daily. scheduled       Scheduled:  . buPROPion  300 mg Oral Daily  . carisoprodol  350 mg Oral TID  . enoxaparin (LOVENOX) injection  40 mg Subcutaneous Q24H  . FLUoxetine  40 mg Oral Daily  . gabapentin  600 mg Oral TID  . levothyroxine  50 mcg Oral QAC breakfast  .  pantoprazole  40 mg Oral Daily  . vancomycin  1,000 mg Intravenous Once  . vancomycin  1,000 mg Intravenous Q12H   Infusions:  . sodium chloride 75 mL/hr at 03/28/14 0342    Assessment: 63yo female c/o wound to right ankle w/ some redness and swelling, notes multiple falls at home, to begin IV ABX for cellulitis.  Goal of Therapy:  Vancomycin trough level 10-15 mcg/ml  Plan:  Will begin vancomycin 1000mg  IV Q12H and monitor CBC, Cx, levels prn.  Wynona Neat, PharmD, BCPS  03/28/2014,8:01 AM

## 2014-03-28 NOTE — Progress Notes (Signed)
Patient admitted earlier this morning for right leg cellulitis seen, cellulitis improved, no fever or leukocytosis. Switch from clindamycin to vancomycin and monitor. Lower extremity venous duplex to follow. Likely discharge in the morning if remains stable.

## 2014-04-01 NOTE — ED Provider Notes (Signed)
I saw and evaluated the patient, reviewed the resident's note and I agree with the findings and plan.   EKG Interpretation None      [age]-year-old female with cellulitis right lower extremity. Probably began after some minor trauma to her leg. Patient has been on Keflex, albeit for only a few doses.I wouldn't consider this outpatient failure at this point, but may still be prudent to admit for further treatment.   Virgel Manifold, MD 04/01/14 [phone]

## 2014-04-03 LAB — CULTURE, BLOOD (ROUTINE X 2)
CULTURE: NO GROWTH
Culture: NO GROWTH

## 2014-08-17 ENCOUNTER — Other Ambulatory Visit (HOSPITAL_COMMUNITY): Payer: Self-pay | Admitting: Orthopedic Surgery

## 2014-08-17 MED ORDER — CLINDAMYCIN PHOSPHATE 900 MG/50ML IV SOLN
900.0000 mg | INTRAVENOUS | Status: AC
  Administered 2014-08-18: 900 mg via INTRAVENOUS
  Filled 2014-08-17: qty 50

## 2014-08-17 NOTE — Progress Notes (Signed)
I was unable to reach patient by phone.  I left  A message on voice mail.  I instructed the patient to arrive at [facility] entrance at 7:15, nothing to eat or drink after midnight.   I instructed the patient to take the following medications in the am with just enough water to get them down: Synthroid, Gabapentin, Prozac;  if needed Oxycodone, Xanax I asked patient to not wear any lotions, powders, cologne, jewelry, piercing, make-up or nail polish.  I asked the patient to call [phone], in the am if there were any questions or problems.

## 2014-08-18 ENCOUNTER — Ambulatory Visit (HOSPITAL_COMMUNITY): Payer: Medicare Other | Admitting: Anesthesiology

## 2014-08-18 ENCOUNTER — Encounter (HOSPITAL_COMMUNITY): Payer: Self-pay | Admitting: *Deleted

## 2014-08-18 ENCOUNTER — Encounter (HOSPITAL_COMMUNITY)
Admission: RE | Disposition: A | Discharge status: Home / Self Care | Payer: Self-pay | Source: Ambulatory Visit | Attending: Orthopedic Surgery

## 2014-08-18 ENCOUNTER — Ambulatory Visit (HOSPITAL_COMMUNITY)
Admission: RE | Admit: 2014-08-18 | Discharge: 2014-08-18 | Disposition: A | Discharge status: Home / Self Care | Payer: Medicare Other | Source: Ambulatory Visit | Attending: Orthopedic Surgery | Admitting: Orthopedic Surgery

## 2014-08-18 DIAGNOSIS — F329 Major depressive disorder, single episode, unspecified: Secondary | ICD-10-CM | POA: Insufficient documentation

## 2014-08-18 DIAGNOSIS — Z882 Allergy status to sulfonamides status: Secondary | ICD-10-CM | POA: Insufficient documentation

## 2014-08-18 DIAGNOSIS — M86171 Other acute osteomyelitis, right ankle and foot: Secondary | ICD-10-CM

## 2014-08-18 DIAGNOSIS — M797 Fibromyalgia: Secondary | ICD-10-CM | POA: Diagnosis not present

## 2014-08-18 DIAGNOSIS — F419 Anxiety disorder, unspecified: Secondary | ICD-10-CM | POA: Insufficient documentation

## 2014-08-18 DIAGNOSIS — M868X7 Other osteomyelitis, ankle and foot: Secondary | ICD-10-CM | POA: Insufficient documentation

## 2014-08-18 DIAGNOSIS — E039 Hypothyroidism, unspecified: Secondary | ICD-10-CM | POA: Insufficient documentation

## 2014-08-18 DIAGNOSIS — Z880 Allergy status to penicillin: Secondary | ICD-10-CM | POA: Diagnosis not present

## 2014-08-18 DIAGNOSIS — K219 Gastro-esophageal reflux disease without esophagitis: Secondary | ICD-10-CM | POA: Insufficient documentation

## 2014-08-18 HISTORY — DX: Major depressive disorder, single episode, unspecified: F32.9

## 2014-08-18 HISTORY — DX: Unspecified osteoarthritis, unspecified site: M19.90

## 2014-08-18 HISTORY — DX: Other specified postprocedural states: Z98.890

## 2014-08-18 HISTORY — PX: AMPUTATION: SHX166

## 2014-08-18 HISTORY — DX: Irritable bowel syndrome, unspecified: K58.9

## 2014-08-18 HISTORY — DX: Anxiety disorder, unspecified: F41.9

## 2014-08-18 HISTORY — DX: Spina bifida, unspecified: Q05.9

## 2014-08-18 HISTORY — DX: Personal history of other diseases of the digestive system: Z87.19

## 2014-08-18 HISTORY — DX: Malignant neoplasm of bladder, unspecified: C67.9

## 2014-08-18 HISTORY — DX: Unspecified urinary incontinence: R32

## 2014-08-18 HISTORY — DX: Depression, unspecified: F32.A

## 2014-08-18 HISTORY — DX: Hypothyroidism, unspecified: E03.9

## 2014-08-18 HISTORY — DX: Gastro-esophageal reflux disease without esophagitis: K21.9

## 2014-08-18 HISTORY — DX: Nausea with vomiting, unspecified: R11.2

## 2014-08-18 LAB — CBC
HEMATOCRIT: 37.4 % (ref 36.0–46.0)
Hemoglobin: 12.6 g/dL (ref 12.0–15.0)
MCH: 33.2 pg (ref 26.0–34.0)
MCHC: 33.7 g/dL (ref 30.0–36.0)
MCV: 98.4 fL (ref 78.0–100.0)
Platelets: 302 10*3/uL (ref 150–400)
RBC: 3.8 MIL/uL — ABNORMAL LOW (ref 3.87–5.11)
RDW: 14.4 % (ref 11.5–15.5)
WBC: 10.9 10*3/uL — AB (ref 4.0–10.5)

## 2014-08-18 LAB — COMPREHENSIVE METABOLIC PANEL
ALBUMIN: 3.5 g/dL (ref 3.5–5.2)
ALT: 15 U/L (ref 0–35)
ANION GAP: 13 (ref 5–15)
AST: 16 U/L (ref 0–37)
Alkaline Phosphatase: 94 U/L (ref 39–117)
BILIRUBIN TOTAL: 0.3 mg/dL (ref 0.3–1.2)
BUN: 11 mg/dL (ref 6–23)
CHLORIDE: 94 meq/L — AB (ref 96–112)
CO2: 26 mEq/L (ref 19–32)
Calcium: 9.4 mg/dL (ref 8.4–10.5)
Creatinine, Ser: 0.6 mg/dL (ref 0.50–1.10)
GFR calc non Af Amer: >90 mL/min (ref >90)
GLUCOSE: 90 mg/dL (ref 70–99)
POTASSIUM: 4.4 meq/L (ref 3.7–5.3)
Sodium: 133 mEq/L — ABNORMAL LOW (ref 137–147)
Total Protein: 6.9 g/dL (ref 6.0–8.3)

## 2014-08-18 LAB — PROTIME-INR
INR: 1.05 (ref 0.00–1.49)
Prothrombin Time: 13.8 seconds (ref 11.6–15.2)

## 2014-08-18 LAB — APTT: aPTT: 33 seconds (ref 24–37)

## 2014-08-18 SURGERY — AMPUTATION, FOOT, RAY
Anesthesia: Monitor Anesthesia Care | Site: Foot | Laterality: Right

## 2014-08-18 MED ORDER — HYDROMORPHONE HCL 1 MG/ML IJ SOLN
0.2500 mg | INTRAMUSCULAR | Status: DC | PRN
  Administered 2014-08-18 (×2): 0.5 mg via INTRAVENOUS

## 2014-08-18 MED ORDER — LACTATED RINGERS IV SOLN
INTRAVENOUS | Status: DC | PRN
  Administered 2014-08-18: 09:00:00 via INTRAVENOUS

## 2014-08-18 MED ORDER — ONDANSETRON HCL 4 MG/2ML IJ SOLN
INTRAMUSCULAR | Status: AC
  Filled 2014-08-18: qty 2

## 2014-08-18 MED ORDER — FENTANYL CITRATE 0.05 MG/ML IJ SOLN
INTRAMUSCULAR | Status: AC
Start: 2014-08-18 — End: 2014-08-18
  Administered 2014-08-18: 100 ug
  Filled 2014-08-18: qty 2

## 2014-08-18 MED ORDER — ONDANSETRON HCL 4 MG/2ML IJ SOLN
INTRAMUSCULAR | Status: DC | PRN
  Administered 2014-08-18: 4 mg via INTRAVENOUS

## 2014-08-18 MED ORDER — FENTANYL CITRATE 0.05 MG/ML IJ SOLN
INTRAMUSCULAR | Status: DC | PRN
  Administered 2014-08-18 (×2): 50 ug via INTRAVENOUS

## 2014-08-18 MED ORDER — MIDAZOLAM HCL 2 MG/2ML IJ SOLN
INTRAMUSCULAR | Status: AC
  Administered 2014-08-18: 2 mg
  Filled 2014-08-18: qty 2

## 2014-08-18 MED ORDER — HYDROMORPHONE HCL 1 MG/ML IJ SOLN
INTRAMUSCULAR | Status: AC
  Filled 2014-08-18: qty 1

## 2014-08-18 MED ORDER — FENTANYL CITRATE 0.05 MG/ML IJ SOLN: 50.0000 ug | INTRAMUSCULAR | Status: DC | PRN

## 2014-08-18 MED ORDER — MIDAZOLAM HCL 2 MG/2ML IJ SOLN
INTRAMUSCULAR | Status: AC
  Filled 2014-08-18: qty 2

## 2014-08-18 MED ORDER — FENTANYL CITRATE 0.05 MG/ML IJ SOLN
INTRAMUSCULAR | Status: AC
  Filled 2014-08-18: qty 5

## 2014-08-18 MED ORDER — PROPOFOL INFUSION 10 MG/ML OPTIME
INTRAVENOUS | Status: DC | PRN
  Administered 2014-08-18: 100 ug/kg/min via INTRAVENOUS

## 2014-08-18 MED ORDER — 0.9 % SODIUM CHLORIDE (POUR BTL) OPTIME
TOPICAL | Status: DC | PRN
  Administered 2014-08-18: 1000 mL

## 2014-08-18 MED ORDER — MIDAZOLAM HCL 2 MG/2ML IJ SOLN: 1.0000 mg | INTRAMUSCULAR | Status: DC | PRN

## 2014-08-18 MED ORDER — LIDOCAINE HCL (CARDIAC) 20 MG/ML IV SOLN
INTRAVENOUS | Status: AC
  Filled 2014-08-18: qty 5

## 2014-08-18 MED ORDER — PROPOFOL 10 MG/ML IV BOLUS
INTRAVENOUS | Status: AC
  Filled 2014-08-18: qty 20

## 2014-08-18 MED ORDER — LIDOCAINE HCL (CARDIAC) 20 MG/ML IV SOLN
INTRAVENOUS | Status: DC | PRN
  Administered 2014-08-18: 40 mg via INTRAVENOUS

## 2014-08-18 MED ORDER — LACTATED RINGERS IV SOLN
INTRAVENOUS | Status: DC
  Administered 2014-08-18: 09:00:00 via INTRAVENOUS

## 2014-08-18 SURGICAL SUPPLY — 29 items
BLADE SAW SGTL MED 73X18.5 STR (BLADE) IMPLANT
BNDG COHESIVE 4X5 TAN STRL (GAUZE/BANDAGES/DRESSINGS) ×3 IMPLANT
BNDG GAUZE ELAST 4 BULKY (GAUZE/BANDAGES/DRESSINGS) ×3 IMPLANT
COVER SURGICAL LIGHT HANDLE (MISCELLANEOUS) ×3 IMPLANT
DRAPE U-SHAPE 47X51 STRL (DRAPES) ×6 IMPLANT
DRSG ADAPTIC 3X8 NADH LF (GAUZE/BANDAGES/DRESSINGS) ×3 IMPLANT
DRSG PAD ABDOMINAL 8X10 ST (GAUZE/BANDAGES/DRESSINGS) ×4 IMPLANT
DURAPREP 26ML APPLICATOR (WOUND CARE) ×3 IMPLANT
ELECT REM PT RETURN 9FT ADLT (ELECTROSURGICAL) ×3
ELECTRODE REM PT RTRN 9FT ADLT (ELECTROSURGICAL) ×1 IMPLANT
GAUZE SPONGE 4X4 12PLY STRL (GAUZE/BANDAGES/DRESSINGS) ×3 IMPLANT
GLOVE BIOGEL PI IND STRL 9 (GLOVE) ×1 IMPLANT
GLOVE BIOGEL PI INDICATOR 9 (GLOVE) ×2
GLOVE SURG ORTHO 9.0 STRL STRW (GLOVE) ×3 IMPLANT
GOWN STRL REUS W/ TWL XL LVL3 (GOWN DISPOSABLE) ×2 IMPLANT
GOWN STRL REUS W/TWL XL LVL3 (GOWN DISPOSABLE) ×6
KIT BASIN OR (CUSTOM PROCEDURE TRAY) ×3 IMPLANT
KIT ROOM TURNOVER OR (KITS) ×3 IMPLANT
NS IRRIG 1000ML POUR BTL (IV SOLUTION) ×3 IMPLANT
PACK ORTHO EXTREMITY (CUSTOM PROCEDURE TRAY) ×3 IMPLANT
PAD ARMBOARD 7.5X6 YLW CONV (MISCELLANEOUS) ×6 IMPLANT
SPONGE GAUZE 4X4 12PLY STER LF (GAUZE/BANDAGES/DRESSINGS) ×2 IMPLANT
SPONGE LAP 18X18 X RAY DECT (DISPOSABLE) ×3 IMPLANT
STOCKINETTE IMPERVIOUS LG (DRAPES) IMPLANT
SUT ETHILON 2 0 PSLX (SUTURE) ×6 IMPLANT
TOWEL OR 17X24 6PK STRL BLUE (TOWEL DISPOSABLE) ×3 IMPLANT
TOWEL OR 17X26 10 PK STRL BLUE (TOWEL DISPOSABLE) ×3 IMPLANT
UNDERPAD 30X30 INCONTINENT (UNDERPADS AND DIAPERS) ×3 IMPLANT
WATER STERILE IRR 1000ML POUR (IV SOLUTION) ×3 IMPLANT

## 2014-08-18 NOTE — Anesthesia Postprocedure Evaluation (Signed)
  Anesthesia Post-op Note  Patient: Natalie Hampton  Procedure(s) Performed: Procedure(s): 1st Ray Amputation (Right)  Patient Location: PACU  Anesthesia Type:MAC  Level of Consciousness: awake  Airway and Oxygen Therapy: Patient Spontanous Breathing  Post-op Pain: mild  Post-op Assessment: Post-op Vital signs reviewed  Post-op Vital Signs: Reviewed  Last Vitals:  Filed Vitals:   08/18/14 1136  BP: 114/65  Pulse: 78  Temp: 36.2 C  Resp: 14    Complications: No apparent anesthesia complications

## 2014-08-18 NOTE — Anesthesia Preprocedure Evaluation (Signed)
Anesthesia Evaluation  Patient identified by MRN, date of birth, ID band Patient awake    Reviewed: Allergy & Precautions, H&P , NPO status , Patient's Chart, lab work & pertinent test results  History of Anesthesia Complications (+) PONV  Airway Mallampati: II       Dental no notable dental hx.    Pulmonary          Cardiovascular negative cardio ROS  Rhythm:Regular Rate:Normal     Neuro/Psych Anxiety Depression    GI/Hepatic Neg liver ROS, hiatal hernia, GERD-  ,  Endo/Other  Hypothyroidism   Renal/GU negative Renal ROS     Musculoskeletal  (+) Arthritis -, Fibromyalgia -  Abdominal   Peds  Hematology   Anesthesia Other Findings   Reproductive/Obstetrics                             Anesthesia Physical Anesthesia Plan  ASA: III  Anesthesia Plan: MAC   Post-op Pain Management:    Induction: Intravenous  Airway Management Planned:   Additional Equipment:   Intra-op Plan:   Post-operative Plan:   Informed Consent: I have reviewed the patients History and Physical, chart, labs and discussed the procedure including the risks, benefits and alternatives for the proposed anesthesia with the patient or authorized representative who has indicated his/her understanding and acceptance.   Dental advisory given  Plan Discussed with: Anesthesiologist and CRNA  Anesthesia Plan Comments:         Anesthesia Quick Evaluation

## 2014-08-18 NOTE — Transfer of Care (Signed)
Immediate Anesthesia Transfer of Care Note  Patient: Natalie Hampton  Procedure(s) Performed: Procedure(s): 1st Ray Amputation (Right)  Patient Location: PACU  Anesthesia Type:MAC combined with regional for post-op pain  Level of Consciousness: awake, alert , oriented and patient cooperative  Airway & Oxygen Therapy: Patient Spontanous Breathing and Patient connected to nasal cannula oxygen  Post-op Assessment: Report given to PACU RN and Post -op Vital signs reviewed and stable  Post vital signs: Reviewed and stable  Complications: No apparent anesthesia complications

## 2014-08-18 NOTE — Discharge Instructions (Signed)

## 2014-08-18 NOTE — Op Note (Signed)
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    The risks benefits and alternatives were discussed with the patient preoperatively including but not limited to the risks of infection, bleeding, nerve injury, cardiopulmonary complications, the need for revision surgery, among others, and the patient was willing to proceed.  OPERATIVE IMPLANTS: none  OPERATIVE FINDINGS: Good petechial bleeding  OPERATIVE PROCEDURE: Patient has failed conservative care and presents at this time for right foot first ray amputation with osteomyelitis of the first metatarsal head.  Patient was brought to the operating room and underwent a general anesthetic. After adequate levels and anesthesia obtained patient's right lower extremity was prepped using DuraPrep draped into a sterile field. A racquet incision was made around the first metatarsal to include the ulcer beneath the first metatarsal head. The ulcer skin and first ray were resected in one block of tissue. Electrocautery was used for hemostasis. The wound was irrigated with normal saline. Local tissue rearrangement was performed to close the wound which was 4 x 6 cm. Sterile compressive dressing was applied. Patient was extubated taken to the PACU in stable condition.

## 2014-08-18 NOTE — H&P (Signed)
Natalie Hampton is an 63 y.o. female.   Chief Complaint: Osteomyelitis ulceration right great toe HPI: Patient is a 63 year old woman who has failed conservative wound care with osteomyelitis ulceration right great toe  Past Medical History  Diagnosis Date  . Fibromyalgia   . Chronic fatigue     Past Surgical History  Procedure Laterality Date  . Ankle surgery      No family history on file. Social History:  reports that she has never smoked. She does not have any smokeless tobacco history on file. She reports that she drinks alcohol. She reports that she does not use illicit drugs.  Allergies:  Allergies  Allergen Reactions  . Penicillins Anaphylaxis  . Sulfa Antibiotics Hives    No prescriptions prior to admission    No results found for this or any previous visit (from the past 48 hour(s)). No results found.  Review of Systems  All other systems reviewed and are negative.   There were no vitals taken for this visit. Physical Exam  On examination patient has a palpable pulse. She has a draining ulcer which probes to bone with osteomyelitis of the great toe. Assessment/Plan Assessment: Ulceration osteomyelitis right great toe.  Plan: We'll plan for amputation of the first ray right foot. Risks and benefits were discussed including risk of the wound nonhealing need for additional surgery. Patient states she understands and wished to proceed at this time.  DUDA,MARCUS V 08/18/2014, 6:57 AM

## 2014-08-18 NOTE — Progress Notes (Signed)
CRNA at bedside.

## 2014-08-18 NOTE — Anesthesia Procedure Notes (Signed)
Anesthesia Regional Block:  Ankle block  Pre-Anesthetic Checklist: ,, timeout performed, Correct Patient, Correct Site, Correct Laterality, Correct Procedure, Correct Position, site marked, Risks and benefits discussed, Surgical consent,  Pre-op evaluation,  Post-op pain management  Laterality: Right  Prep: chloraprep       Needles:       Needle Gauge: 22 and 22 G    Additional Needles: Ankle block Narrative:  Start time: 08/18/2014 9:05 AM End time: 08/18/2014 9:20 AM Injection made incrementally with aspirations every 5 mL.  Performed by: Personally

## 2014-08-18 NOTE — Progress Notes (Signed)
Orthopedic Tech Progress Note Patient Details:  Natalie Hampton [birth year] [id number]  Ortho Devices Type of Ortho Device: Postop shoe/boot Ortho Device/Splint Location: trle Ortho Device/Splint Interventions: Application  vieded order from doctor's order list  Hildred Priest 08/18/2014, 10:29 AM

## 2014-08-19 NOTE — Discharge Summary (Signed)
  Discharged to home in stable condition. Final diagnosis osteomyelitis right foot first metatarsal head. Surgical procedure right foot first ray amputation. Follow-up in the office in one week.

## 2014-08-20 ENCOUNTER — Encounter (HOSPITAL_COMMUNITY): Payer: Self-pay | Admitting: Orthopedic Surgery

## 2015-12-16 ENCOUNTER — Other Ambulatory Visit: Payer: Self-pay | Admitting: Internal Medicine

## 2015-12-16 DIAGNOSIS — Z1231 Encounter for screening mammogram for malignant neoplasm of breast: Secondary | ICD-10-CM

## 2016-12-26 ENCOUNTER — Encounter (HOSPITAL_COMMUNITY): Payer: Self-pay

## 2016-12-26 ENCOUNTER — Emergency Department (HOSPITAL_COMMUNITY): Payer: Medicare Other

## 2016-12-26 ENCOUNTER — Observation Stay (HOSPITAL_COMMUNITY)
Admission: EM | Admit: 2016-12-26 | Discharge: 2016-12-29 | Disposition: A | Discharge status: Home / Self Care | Payer: Medicare Other | Attending: Internal Medicine | Admitting: Internal Medicine

## 2016-12-26 DIAGNOSIS — Z79890 Hormone replacement therapy (postmenopausal): Secondary | ICD-10-CM | POA: Insufficient documentation

## 2016-12-26 DIAGNOSIS — Z8551 Personal history of malignant neoplasm of bladder: Secondary | ICD-10-CM | POA: Diagnosis not present

## 2016-12-26 DIAGNOSIS — Q059 Spina bifida, unspecified: Secondary | ICD-10-CM | POA: Insufficient documentation

## 2016-12-26 DIAGNOSIS — Z923 Personal history of irradiation: Secondary | ICD-10-CM | POA: Insufficient documentation

## 2016-12-26 DIAGNOSIS — D649 Anemia, unspecified: Secondary | ICD-10-CM | POA: Insufficient documentation

## 2016-12-26 DIAGNOSIS — K580 Irritable bowel syndrome with diarrhea: Secondary | ICD-10-CM | POA: Insufficient documentation

## 2016-12-26 DIAGNOSIS — R5382 Chronic fatigue, unspecified: Secondary | ICD-10-CM | POA: Diagnosis not present

## 2016-12-26 DIAGNOSIS — Z89411 Acquired absence of right great toe: Secondary | ICD-10-CM | POA: Diagnosis not present

## 2016-12-26 DIAGNOSIS — F329 Major depressive disorder, single episode, unspecified: Secondary | ICD-10-CM | POA: Insufficient documentation

## 2016-12-26 DIAGNOSIS — Z79899 Other long term (current) drug therapy: Secondary | ICD-10-CM | POA: Diagnosis not present

## 2016-12-26 DIAGNOSIS — L97512 Non-pressure chronic ulcer of other part of right foot with fat layer exposed: Secondary | ICD-10-CM | POA: Diagnosis not present

## 2016-12-26 DIAGNOSIS — Z79891 Long term (current) use of opiate analgesic: Secondary | ICD-10-CM | POA: Insufficient documentation

## 2016-12-26 DIAGNOSIS — E871 Hypo-osmolality and hyponatremia: Secondary | ICD-10-CM | POA: Diagnosis not present

## 2016-12-26 DIAGNOSIS — F419 Anxiety disorder, unspecified: Secondary | ICD-10-CM | POA: Insufficient documentation

## 2016-12-26 DIAGNOSIS — K219 Gastro-esophageal reflux disease without esophagitis: Secondary | ICD-10-CM | POA: Insufficient documentation

## 2016-12-26 DIAGNOSIS — R6510 Systemic inflammatory response syndrome (SIRS) of non-infectious origin without acute organ dysfunction: Secondary | ICD-10-CM | POA: Diagnosis not present

## 2016-12-26 DIAGNOSIS — M86271 Subacute osteomyelitis, right ankle and foot: Secondary | ICD-10-CM

## 2016-12-26 DIAGNOSIS — L03119 Cellulitis of unspecified part of limb: Secondary | ICD-10-CM | POA: Diagnosis present

## 2016-12-26 DIAGNOSIS — G894 Chronic pain syndrome: Secondary | ICD-10-CM | POA: Diagnosis not present

## 2016-12-26 DIAGNOSIS — L03115 Cellulitis of right lower limb: Principal | ICD-10-CM | POA: Insufficient documentation

## 2016-12-26 DIAGNOSIS — M797 Fibromyalgia: Secondary | ICD-10-CM | POA: Diagnosis not present

## 2016-12-26 DIAGNOSIS — E039 Hypothyroidism, unspecified: Secondary | ICD-10-CM | POA: Insufficient documentation

## 2016-12-26 DIAGNOSIS — M79673 Pain in unspecified foot: Secondary | ICD-10-CM | POA: Diagnosis present

## 2016-12-26 LAB — COMPREHENSIVE METABOLIC PANEL
ALT: 15 U/L (ref 14–54)
AST: 15 U/L (ref 15–41)
Albumin: 3 g/dL — ABNORMAL LOW (ref 3.5–5.0)
Alkaline Phosphatase: 98 U/L (ref 38–126)
Anion gap: 9 (ref 5–15)
BUN: 7 mg/dL (ref 6–20)
CO2: 26 mmol/L (ref 22–32)
Calcium: 8.7 mg/dL — ABNORMAL LOW (ref 8.9–10.3)
Chloride: 96 mmol/L — ABNORMAL LOW (ref 101–111)
Creatinine, Ser: 0.62 mg/dL (ref 0.44–1.00)
GFR calc Af Amer: >60 mL/min (ref >60)
GFR calc non Af Amer: >60 mL/min (ref >60)
Glucose, Bld: 106 mg/dL — ABNORMAL HIGH (ref 65–99)
POTASSIUM: 4.2 mmol/L (ref 3.5–5.1)
SODIUM: 131 mmol/L — AB (ref 135–145)
Total Bilirubin: 0.1 mg/dL — ABNORMAL LOW (ref 0.3–1.2)
Total Protein: 6.7 g/dL (ref 6.5–8.1)

## 2016-12-26 LAB — CBC
HCT: 31 % — ABNORMAL LOW (ref 36.0–46.0)
Hemoglobin: 10 g/dL — ABNORMAL LOW (ref 12.0–15.0)
MCH: 30.9 pg (ref 26.0–34.0)
MCHC: 32.3 g/dL (ref 30.0–36.0)
MCV: 95.7 fL (ref 78.0–100.0)
Platelets: 397 10*3/uL (ref 150–400)
RBC: 3.24 MIL/uL — ABNORMAL LOW (ref 3.87–5.11)
RDW: 14.8 % (ref 11.5–15.5)
WBC: 15.5 10*3/uL — ABNORMAL HIGH (ref 4.0–10.5)

## 2016-12-26 LAB — I-STAT CG4 LACTIC ACID, ED
Lactic Acid, Venous: 0.95 mmol/L (ref 0.5–1.9)
Lactic Acid, Venous: 1.2 mmol/L (ref 0.5–1.9)

## 2016-12-26 MED ORDER — ACETAMINOPHEN 650 MG RE SUPP: 650.0000 mg | Freq: Four times a day (QID) | RECTAL | Status: DC | PRN

## 2016-12-26 MED ORDER — BUPROPION HCL ER (XL) 150 MG PO TB24
300.0000 mg | ORAL_TABLET | Freq: Every day | ORAL | Status: DC
  Administered 2016-12-27 – 2016-12-29 (×3): 300 mg via ORAL
  Filled 2016-12-26 (×3): qty 2

## 2016-12-26 MED ORDER — GABAPENTIN 600 MG PO TABS
600.0000 mg | ORAL_TABLET | Freq: Three times a day (TID) | ORAL | Status: DC
Start: 2016-12-26 — End: 2016-12-29
  Administered 2016-12-26 – 2016-12-29 (×8): 600 mg via ORAL
  Filled 2016-12-26 (×8): qty 1

## 2016-12-26 MED ORDER — ONDANSETRON HCL 4 MG PO TABS: 4.0000 mg | ORAL_TABLET | Freq: Four times a day (QID) | ORAL | Status: DC | PRN

## 2016-12-26 MED ORDER — ONDANSETRON HCL 4 MG/2ML IJ SOLN: 4.0000 mg | Freq: Four times a day (QID) | INTRAMUSCULAR | Status: DC | PRN

## 2016-12-26 MED ORDER — IBUPROFEN 400 MG PO TABS
400.0000 mg | ORAL_TABLET | Freq: Four times a day (QID) | ORAL | Status: DC | PRN
  Administered 2016-12-26 – 2016-12-29 (×3): 400 mg via ORAL
  Filled 2016-12-26 (×3): qty 1

## 2016-12-26 MED ORDER — PANTOPRAZOLE SODIUM 40 MG PO TBEC
40.0000 mg | DELAYED_RELEASE_TABLET | Freq: Every day | ORAL | Status: DC
  Administered 2016-12-26 – 2016-12-29 (×4): 40 mg via ORAL
  Filled 2016-12-26 (×4): qty 1

## 2016-12-26 MED ORDER — CARISOPRODOL 350 MG PO TABS
350.0000 mg | ORAL_TABLET | Freq: Three times a day (TID) | ORAL | Status: DC
  Administered 2016-12-26 – 2016-12-29 (×8): 350 mg via ORAL
  Filled 2016-12-26 (×8): qty 1

## 2016-12-26 MED ORDER — SERTRALINE HCL 50 MG PO TABS
50.0000 mg | ORAL_TABLET | Freq: Every day | ORAL | Status: DC
  Administered 2016-12-26 – 2016-12-29 (×4): 50 mg via ORAL
  Filled 2016-12-26 (×4): qty 1

## 2016-12-26 MED ORDER — VANCOMYCIN HCL IN DEXTROSE 1-5 GM/200ML-% IV SOLN
1000.0000 mg | Freq: Once | INTRAVENOUS | Status: AC
  Administered 2016-12-26: 1000 mg via INTRAVENOUS
  Filled 2016-12-26: qty 200

## 2016-12-26 MED ORDER — ENOXAPARIN SODIUM 40 MG/0.4ML ~~LOC~~ SOLN
40.0000 mg | SUBCUTANEOUS | Status: DC
  Administered 2016-12-26 – 2016-12-28 (×3): 40 mg via SUBCUTANEOUS
  Filled 2016-12-26 (×3): qty 0.4

## 2016-12-26 MED ORDER — VANCOMYCIN HCL IN DEXTROSE 750-5 MG/150ML-% IV SOLN
750.0000 mg | Freq: Two times a day (BID) | INTRAVENOUS | Status: DC
  Administered 2016-12-27 – 2016-12-29 (×5): 750 mg via INTRAVENOUS
  Filled 2016-12-26 (×7): qty 150

## 2016-12-26 MED ORDER — LEVOTHYROXINE SODIUM 50 MCG PO TABS
50.0000 ug | ORAL_TABLET | Freq: Every day | ORAL | Status: DC
  Administered 2016-12-27 – 2016-12-29 (×3): 50 ug via ORAL
  Filled 2016-12-26 (×3): qty 1

## 2016-12-26 MED ORDER — TETRAHYDROZOLINE HCL 0.05 % OP SOLN: 1.0000 [drp] | Freq: Every day | OPHTHALMIC | Status: DC | PRN

## 2016-12-26 MED ORDER — ACETAMINOPHEN 325 MG PO TABS
650.0000 mg | ORAL_TABLET | Freq: Four times a day (QID) | ORAL | Status: DC | PRN
  Administered 2016-12-28: 650 mg via ORAL
  Filled 2016-12-26: qty 2

## 2016-12-26 MED ORDER — HYDROCODONE-ACETAMINOPHEN 10-325 MG PO TABS
1.0000 | ORAL_TABLET | Freq: Four times a day (QID) | ORAL | Status: DC | PRN
  Administered 2016-12-26 – 2016-12-29 (×4): 1 via ORAL
  Filled 2016-12-26 (×5): qty 1

## 2016-12-26 MED ORDER — DIPHENOXYLATE-ATROPINE 2.5-0.025 MG PO TABS
1.0000 | ORAL_TABLET | Freq: Every day | ORAL | Status: DC | PRN
  Administered 2016-12-26 – 2016-12-27 (×2): 1 via ORAL
  Filled 2016-12-26 (×2): qty 1

## 2016-12-26 MED ORDER — ESTRADIOL 1 MG PO TABS
1.0000 mg | ORAL_TABLET | Freq: Every day | ORAL | Status: DC
  Administered 2016-12-27 – 2016-12-29 (×3): 1 mg via ORAL
  Filled 2016-12-26 (×3): qty 1

## 2016-12-26 MED ORDER — SODIUM CHLORIDE 0.9 % IV BOLUS (SEPSIS)
1000.0000 mL | Freq: Once | INTRAVENOUS | Status: AC
  Administered 2016-12-26: 1000 mL via INTRAVENOUS

## 2016-12-26 MED ORDER — ALPRAZOLAM 0.5 MG PO TABS
1.0000 mg | ORAL_TABLET | Freq: Four times a day (QID) | ORAL | Status: DC
  Administered 2016-12-26 – 2016-12-29 (×10): 1 mg via ORAL
  Filled 2016-12-26 (×10): qty 2

## 2016-12-26 MED ORDER — SODIUM CHLORIDE 0.9 % IV SOLN
INTRAVENOUS | Status: DC
  Administered 2016-12-26 – 2016-12-27 (×2): via INTRAVENOUS

## 2016-12-26 MED ORDER — ADULT MULTIVITAMIN W/MINERALS CH
1.0000 | ORAL_TABLET | Freq: Every day | ORAL | Status: DC
  Administered 2016-12-27 – 2016-12-29 (×3): 1 via ORAL
  Filled 2016-12-26 (×3): qty 1

## 2016-12-26 NOTE — ED Provider Notes (Signed)
[facility] DEPT Provider Note   CSN: [id number] Arrival date & time: 12/26/16  1148     History   Chief Complaint Chief Complaint  Patient presents with  . Foot Pain    HPI Natalie Hampton is a 66 y.o. female.  Patient, with history of right first ray amputation done 2.5 years ago, presents with ulceration, redness, swelling of the area of amputation, around the entire right foot, ankle and shin that began 3-4 days ago. She states that she is also and generally not feeling well. She has had no issues with this area since her surgery. She is able to ambulate but states that there is more pain when she bears weight on the foot. She states that she has been taking pain medications so unsure if she has felt feverish. States that it is not normal for the area to be this red, warm, swollen. States that the skin has been peeling from the area on its own since symptoms began. Original amputation was done 2.5 years ago for osteomyelitis. Patient denies history of diabetes. She denies chest pain, shortness of breath, abdominal pain, numbness/weakness, headaches, LOC.      Past Medical History:  Diagnosis Date  . Anxiety   . Arthritis   . Bladder cancer ([place]) 20 years ago  . Chronic fatigue   . Depression   . Fibromyalgia   . GERD (gastroesophageal reflux disease)   . History of hiatal hernia   . Hypothyroidism   . IBS (irritable bowel syndrome)   . PONV (postoperative nausea and vomiting)   . Spina bifida ([place])   . Urinary incontinence    From bladder cancer and radiation    Patient Active Problem List   Diagnosis Date Noted  . Cellulitis of leg 12/26/2016  . Cellulitis 03/28/2014  . Fibromyalgia   . Chronic fatigue     Past Surgical History:  Procedure Laterality Date  . ABDOMINAL HYSTERECTOMY    . AMPUTATION Right 08/18/2014   Procedure: 1st Ray Amputation;  Surgeon: Newt Minion, MD;  Location: [facility];  Service: Orthopedics;  Laterality: Right;  . ANKLE SURGERY    .  APPENDECTOMY    . CHOLECYSTECTOMY    . COLONOSCOPY    . ESOPHAGOGASTRODUODENOSCOPY    . EYE SURGERY Bilateral    "almost" retinal detachment  . FOOT SURGERY Right    Pt reports about 15 surgeries on right ankle & foot.    OB History    No data available       Home Medications    Prior to Admission medications   Medication Sig Start Date End Date Taking? Authorizing Provider  alprazolam Duanne Moron) 2 MG tablet Take 2 mg by mouth 3 (three) times daily. scheduled 03/15/14   Historical Provider, MD  buPROPion (WELLBUTRIN XL) 300 MG 24 hr tablet Take 300 mg by mouth daily.  03/09/14   Historical Provider, MD  carisoprodol (SOMA) 350 MG tablet Take 350 mg by mouth 3 (three) times daily.    Historical Provider, MD  doxycycline (VIBRAMYCIN) 100 MG capsule Take 1 capsule (100 mg total) by mouth 2 (two) times daily. 03/28/14   Thurnell Lose, MD  estradiol (ESTRACE) 1 MG tablet Take 1 mg by mouth daily.  03/09/14   Historical Provider, MD  FLUoxetine (PROZAC) 40 MG capsule Take 40 mg by mouth daily.  03/09/14   Historical Provider, MD  gabapentin (NEURONTIN) 600 MG tablet Take 600 mg by mouth 3 (three) times daily.  03/09/14  Historical Provider, MD  levothyroxine (SYNTHROID, LEVOTHROID) 50 MCG tablet Take 50 mcg by mouth daily.  03/09/14   Historical Provider, MD  Multiple Vitamin (MULTIVITAMIN WITH MINERALS) TABS tablet Take 1 tablet by mouth daily.    Historical Provider, MD  omeprazole (PRILOSEC) 40 MG capsule Take 40 mg by mouth daily.  03/09/14   Historical Provider, MD  Oxycodone HCl 10 MG TABS Take 10 mg by mouth 3 (three) times daily. scheduled 03/20/14   Historical Provider, MD    Family History History reviewed. No pertinent family history.  Social History Social History  Substance Use Topics  . Smoking status: Never Smoker  . Smokeless tobacco: Never Used  . Alcohol use Yes     Comment: rare     Allergies   Penicillins and Sulfa antibiotics   Review of Systems Review of  Systems  Constitutional: Positive for chills and fever. Negative for appetite change.  HENT: Negative for ear pain, rhinorrhea, sneezing and sore throat.   Eyes: Negative for photophobia and visual disturbance.  Respiratory: Negative for cough, chest tightness, shortness of breath and wheezing.   Cardiovascular: Negative for chest pain and palpitations.  Gastrointestinal: Negative for abdominal pain, blood in stool, constipation, diarrhea, nausea and vomiting.  Genitourinary: Negative for dysuria, hematuria and urgency.  Musculoskeletal: Positive for joint swelling. Negative for myalgias.  Skin: Positive for color change and rash.  Neurological: Negative for dizziness, weakness and light-headedness.     Physical Exam Updated Vital Signs BP (!) 142/83   Pulse (!) 103   Temp 98.8 F (37.1 C) (Oral)   Resp 16   Ht 5\' 3"  (1.6 m)   Wt 78 kg   SpO2 99%   BMI 30.47 kg/m   Physical Exam  Constitutional: She appears well-developed and well-nourished. No distress.  HENT:  Head: Normocephalic and atraumatic.  Nose: Nose normal.  Eyes: Conjunctivae and EOM are normal. Left eye exhibits no discharge. No scleral icterus.  Neck: Normal range of motion. Neck supple.  Cardiovascular: Normal rate, regular rhythm, normal heart sounds and intact distal pulses.  Exam reveals no gallop and no friction rub.   No murmur heard. Pulmonary/Chest: Effort normal and breath sounds normal. No respiratory distress.  Abdominal: Soft. Bowel sounds are normal. She exhibits no distension. There is no tenderness. There is no guarding.  Musculoskeletal: Normal range of motion. She exhibits edema, tenderness and deformity.  Amputation of the right large toe. There is a 1 cm area of ulceration and peeling skin at site of amputation. Diffuse swelling of the ankle. There is redness consistent with cellulitis throughout the front of the leg halfway up to the knee.  Neurological: She is alert. She exhibits normal muscle  tone. Coordination normal.  Skin: Skin is warm and dry. No rash noted.  Psychiatric: She has a normal mood and affect.  Nursing note and vitals reviewed.    ED Treatments / Results  Labs (all labs ordered are listed, but only abnormal results are displayed) Labs Reviewed  CBC - Abnormal; Notable for the following:       Result Value   WBC 15.5 (*)    RBC 3.24 (*)    Hemoglobin 10.0 (*)    HCT 31.0 (*)    All other components within normal limits  COMPREHENSIVE METABOLIC PANEL - Abnormal; Notable for the following:    Sodium 131 (*)    Chloride 96 (*)    Glucose, Bld 106 (*)    Calcium 8.7 (*)  Albumin 3.0 (*)    Total Bilirubin 0.1 (*)    All other components within normal limits  CULTURE, BLOOD (ROUTINE X 2)  CULTURE, BLOOD (ROUTINE X 2)  I-STAT CG4 LACTIC ACID, ED  I-STAT CG4 LACTIC ACID, ED    EKG  EKG Interpretation None       Radiology Dg Foot Complete Right  Result Date: 12/26/2016 CLINICAL DATA:  Partial amputation of the right foot. Nonhealing ulcer medial right foot with drainage. EXAM: RIGHT FOOT COMPLETE - 3+ VIEW COMPARISON:  None. FINDINGS: First ray amputation. Soft tissue swelling is seen along the medial and plantar aspects of the foot with a lucent ulcer along the plantar aspect of the dorsal forefoot. No underlying osseous erosion to suggest osteomyelitis. Irregularity along the head of the second proximal phalanx is seen. Old fifth metatarsal fracture. Degenerative changes in the midfoot which may be Charcot in nature. IMPRESSION: 1. Marked soft tissue swelling along the medial and plantar aspects of the foot, with an associated superficial ulceration. No osseous erosion to suggest osteomyelitis. 2. Irregularity of the second proximal phalangeal head may be degenerative. Please correlate clinically. 3. 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 Electronically Signed   By: Lorin Picket M.D.   On: 12/26/2016 12:39     Procedures Procedures (including critical care time)  Medications Ordered in ED Medications  sodium chloride 0.9 % bolus 1,000 mL (not administered)  vancomycin (VANCOCIN) IVPB 1000 mg/200 mL premix (not administered)     Initial Impression / Assessment and Plan / ED Course  I have reviewed the triage vital signs and the nursing notes.  Pertinent labs & imaging results that were available during my care of the patient were reviewed by me and considered in my medical decision making (see chart for details).     Patient's history and symptoms concerning for cellulitis vs. Osteomyelitis. CBC showed elevated WBC count at 15.5. Lactate level was normal. Patient is tachycardic here at 103. She is afebrile however she does take pain medication with acetaminophen in it which she did take prior to arrival. X-ray showed soft tissue swelling and no evidence of osteomyelitis. There is extensive evidence of cellulitis, including erythema, edema, warmth, extending from the site of amputation all the way up to the front of her leg up to her knee. There is no drainage currently. Although she has not tried outpatient treatment, her PCP was concerned that it would require more treatment. Obtained blood cultures 2 here, given fluid bolus and vancomycin 1 g here in the ED per pharmacy consult. Spoke with hospitalist Dr. Clementeen Graham who will admit the patient to medicine.  Patient was discussed with and seen by Dr. Canary Brim.  Final Clinical Impressions(s) / ED Diagnoses   Final diagnoses:  None    New Prescriptions New Prescriptions   No medications on file     Delia Heady, PA-C 12/26/16 [place], MD 01/01/17 [phone]

## 2016-12-26 NOTE — Progress Notes (Signed)
Patient arrived at the unit at 1851 via stretcher.  Admitted to room 21.  No complaint at the time of admission.  Oriented to unit, call light, and meal times. Marcille Blanco, RN

## 2016-12-26 NOTE — ED Notes (Signed)
Pt provided with ginger ale 

## 2016-12-26 NOTE — H&P (Addendum)
TRH H&P   Patient Demographics:    Natalie Hampton, is a 66 y.o. female  MRN: [id number]   DOB - Apr 23, 1951  Admit Date - 12/26/2016  Outpatient Primary MD for the patient is FRIED, Jaymes Graff, MD  Referring MD: Dr. Canary Brim  Outpatient Specialists: Dr. Sharol Given   Patient coming from: Home  Chief Complaint  Patient presents with  . Foot Pain      HPI:    Natalie Hampton  is a 66 y.o. 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 Sharol Given who presented to the ED with 3 day history of redness and swelling in her right foot. She denies noticing any bleeding or foul-smelling discharge from the amputation site. Patient reported having chills and subjective fever for the past 4-5 days. 2 days back she noticed increased erythema around the right foot stump. Redness and swelling started to spread around the dorsum of the foot and to the lower leg. She reports having flulike symptoms with subjective fever and chills. She denies any headache, dizziness, nausea, vomiting, chest pain, shortness of breath, palpitations, abdominal pain, diarrhea or dysuria. Denies any trauma or insect bite. Patient denies use of foot prosthesis and uses an old pair of shoes to ambulate.  In the ED she was afebrile on the tachycardic with normal blood pressure and respiratory rate. Blood work showed WBC of 15.5 K, hemoglobin of 10, sodium of 131, chloride of 96 with normal renal function and lactic acid. X-ray of the right foot showed soft tissue swelling is seen along the medial and plantar aspects of the foot with a lucent ulcer along the plantar aspect of the dorsal forefoot. No signs of osteomyelitis. Patient given IV normal saline bolus and a dose of IV  vancomycin. Blood cultures sent from the ED and hospitalist consulted for admission to medical floor.    Review of systems:    In addition to the HPI above, No Fever-chills, No Headache, No changes with Vision or hearing, No problems swallowing food or Liquids, No Chest pain, Cough or Shortness of Breath, No Abdominal pain, No Nausea or Vomiting, Bowel movements are regular, No Blood in stool or Urine, No dysuria, No new skin rashes or bruises, Pain in the right foot, no bleeding or purulent discharge No new weakness, tingling, numbness in any extremity, No recent weight gain or loss, No polyuria, polydypsia or polyphagia, No significant Mental Stressors.  A full 10 point Review of Systems was done, except as stated above, all other Review of Systems were negative.   With Past History of the following :    Past Medical History:  Diagnosis Date  . Anxiety   . Arthritis   . Bladder cancer ([place]) 20 years ago  . Chronic  fatigue   . Depression   . Fibromyalgia   . GERD (gastroesophageal reflux disease)   . History of hiatal hernia   . Hypothyroidism   . IBS (irritable bowel syndrome)   . PONV (postoperative nausea and vomiting)   . Spina bifida ([place])   . Urinary incontinence    From bladder cancer and radiation      Past Surgical History:  Procedure Laterality Date  . ABDOMINAL HYSTERECTOMY    . AMPUTATION Right 08/18/2014   Procedure: 1st Ray Amputation;  Surgeon: Newt Minion, MD;  Location: [facility];  Service: Orthopedics;  Laterality: Right;  . ANKLE SURGERY    . APPENDECTOMY    . CHOLECYSTECTOMY    . COLONOSCOPY    . ESOPHAGOGASTRODUODENOSCOPY    . EYE SURGERY Bilateral    "almost" retinal detachment  . FOOT SURGERY Right    Pt reports about 15 surgeries on right ankle & foot.      Social History:     Social History  Substance Use Topics  . Smoking status: Never Smoker  . Smokeless tobacco: Never Used  . Alcohol use Yes     Comment: rare     Lives  - At home  Mobility - independent     Family History :   No family history of heart disease or diabetes   Home Medications:   Prior to Admission medications   Medication Sig Start Date End Date Taking? Authorizing Provider  ALPRAZolam Duanne Moron) 1 MG tablet Take 1 mg by mouth 4 (four) times daily. 11/28/16  Yes Historical Provider, MD  buPROPion (WELLBUTRIN XL) 300 MG 24 hr tablet Take 300 mg by mouth daily.  03/09/14  Yes Historical Provider, MD  carisoprodol (SOMA) 350 MG tablet Take 350 mg by mouth 3 (three) times daily.   Yes Historical Provider, MD  diphenoxylate-atropine (LOMOTIL) 2.5-0.025 MG tablet Take 1 tablet by mouth daily as needed for diarrhea or loose stools. 12/04/16  Yes Historical Provider, MD  estradiol (ESTRACE) 1 MG tablet Take 1 mg by mouth daily.  03/09/14  Yes Historical Provider, MD  gabapentin (NEURONTIN) 600 MG tablet Take 600 mg by mouth 3 (three) times daily.  03/09/14  Yes Historical Provider, MD  HYDROcodone-acetaminophen (NORCO) 10-325 MG tablet Take 1 tablet by mouth every 6 (six) hours as needed for pain. 12/06/16  Yes Historical Provider, MD  levothyroxine (SYNTHROID, LEVOTHROID) 50 MCG tablet Take 50 mcg by mouth daily.  03/09/14  Yes Historical Provider, MD  Multiple Vitamin (MULTIVITAMIN WITH MINERALS) TABS tablet Take 1 tablet by mouth daily.   Yes Historical Provider, MD  omeprazole (PRILOSEC) 40 MG capsule Take 40 mg by mouth daily.  03/09/14  Yes Historical Provider, MD  sertraline (ZOLOFT) 50 MG tablet Take 50 mg by mouth daily. 11/30/16  Yes Historical Provider, MD  Tetrahydrozoline HCl (VISINE OP) Place 1 drop into both eyes daily as needed (dry eyes).   Yes Historical Provider, MD  doxycycline (VIBRAMYCIN) 100 MG capsule Take 1 capsule (100 mg total) by mouth 2 (two) times daily. Patient not taking: Reported on 12/26/2016 03/28/14   Thurnell Lose, MD     Allergies:     Allergies  Allergen Reactions  . Penicillins Anaphylaxis  . Sulfa Antibiotics  Hives     Physical Exam:   Vitals  Blood pressure 122/75, pulse 96, temperature 98.8 F (37.1 C), temperature source Oral, resp. rate 16, height 5\' 3"  (1.6 m), weight 78 kg (172 lb), SpO2 98 %.  Gen.: Middle aged female lying in bed not in distress HEENT: No pallor, no icterus, moist oral mucosa, supple neck Chest: Clear to auscultation bilaterally CVS: Normal S1 and S2, no murmurs rub or gallop GI: Soft, nondistended, nontender, bowel sounds present Musculoskeletal: Erythema and swelling over right foot extending to the distal 1/3rd tibia. Erythema with a subcentimer ulceration over the right first ray amputation site with fat layer exposed. No fluctuation but scanty serous discharge from the ulcer site. Warmth with minimal tenderness. No foul-smelling discharge or bleeding from the ulcer site. Normal sensation and distal pulses palpable. CNS: Alert and oriented   Data Review:    CBC  Recent Labs Lab 12/26/16 1205  WBC 15.5*  HGB 10.0*  HCT 31.0*  PLT 397  MCV 95.7  MCH 30.9  MCHC 32.3  RDW 14.8   ------------------------------------------------------------------------------------------------------------------  Chemistries   Recent Labs Lab 12/26/16 1205  NA 131*  K 4.2  CL 96*  CO2 26  GLUCOSE 106*  BUN 7  CREATININE 0.62  CALCIUM 8.7*  AST 15  ALT 15  ALKPHOS 98  BILITOT 0.1*   ------------------------------------------------------------------------------------------------------------------ estimated creatinine clearance is 71.1 mL/min (by C-G formula based on SCr of 0.62 mg/dL). ------------------------------------------------------------------------------------------------------------------ No results for input(s): TSH, T4TOTAL, T3FREE, THYROIDAB in the last 72 hours.  Invalid input(s): FREET3  Coagulation profile No results for input(s): INR, PROTIME in the last 168  hours. ------------------------------------------------------------------------------------------------------------------- No results for input(s): DDIMER in the last 72 hours. -------------------------------------------------------------------------------------------------------------------  Cardiac Enzymes No results for input(s): CKMB, TROPONINI, MYOGLOBIN in the last 168 hours.  Invalid input(s): CK ------------------------------------------------------------------------------------------------------------------ No results found for: BNP   ---------------------------------------------------------------------------------------------------------------  Urinalysis    Component Value Date/Time   COLORURINE YELLOW 03/25/2014 1705   APPEARANCEUR TURBID (A) 03/25/2014 1705   LABSPEC 1.015 03/25/2014 1705   PHURINE 7.0 03/25/2014 1705   GLUCOSEU NEGATIVE 03/25/2014 1705   HGBUR SMALL (A) 03/25/2014 1705   BILIRUBINUR NEGATIVE 03/25/2014 1705   KETONESUR NEGATIVE 03/25/2014 1705   PROTEINUR NEGATIVE 03/25/2014 1705   UROBILINOGEN 0.2 03/25/2014 1705   NITRITE NEGATIVE 03/25/2014 1705   LEUKOCYTESUR LARGE (A) 03/25/2014 1705    ----------------------------------------------------------------------------------------------------------------   Imaging Results:    Dg Foot Complete Right  Result Date: 12/26/2016 CLINICAL DATA:  Partial amputation of the right foot. Nonhealing ulcer medial right foot with drainage. EXAM: RIGHT FOOT COMPLETE - 3+ VIEW COMPARISON:  None. FINDINGS: First ray amputation. Soft tissue swelling is seen along the medial and plantar aspects of the foot with a lucent ulcer along the plantar aspect of the dorsal forefoot. No underlying osseous erosion to suggest osteomyelitis. Irregularity along the head of the second proximal phalanx is seen. Old fifth metatarsal fracture. Degenerative changes in the midfoot which may be Charcot in nature. IMPRESSION: 1. Marked soft  tissue swelling along the medial and plantar aspects of the foot, with an associated superficial ulceration. No osseous erosion to suggest osteomyelitis. 2. Irregularity of the second proximal phalangeal head may be degenerative. Please correlate clinically. 3. 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 Electronically Signed   By: Lorin Picket M.D.   On: 12/26/2016 12:39    My personal review of EKG: None  Assessment & Plan:    Principal Problem:   SIRS (systemic inflammatory response syndrome) (HCC) Secondary to right foot cellulitis with ulcer. See plan as outlined below.  Active Problems:   Right foot cellulitis with ulcer, with fat layer exposed ([place]) Admit to MedSurg. Placed on empiric IV vancomycin. Keep right leg elevated.  Pain control with home pain medications and when necessary ibuprofen. Gentle hydration with IV normal saline. Follow blood culture results. Wound care and PT consulted. No signs of osteomyelitis on x-ray. If no improvement in the next 2448 hrs,  consult Dr. Sharol Given.    Hyponatremia Possibly secondary to dehydration and SIRS. Monitor with gentle hydration.  Hypothyroidism Continue Synthroid.  Chronic pain syndrome Dorna Mai Resume home medications   GERD: On PPI  Anemia Seems chronic with high MCV. recommend outpt follow up and management.   DVT Prophylaxis :  Lovenox -  AM Labs Ordered, also please review Full Orders  Family Communication: Admission, patients condition and plan of care including tests being ordered have been discussed with the patient.  Code Status: Full Code   Likely DC to  home  Condition : Stable  Consults called: Wound care  Admission status: Inpatient  Time spent in minutes : 60   Louellen Molder M.D on 12/26/2016 at 4:20 PM  Between 7am to 7pm - Pager - [phone]. After 7pm go to [email or web address] - password [facility]  [facility] - Office  [phone]

## 2016-12-26 NOTE — Progress Notes (Signed)
Pharmacy Antibiotic Note  Natalie Hampton is a 66 y.o. female admitted on 12/26/2016 with cellulitis.  Pharmacy has been consulted for vancomycin dosing.  Plan: Vancomycin 1g IV x1, then 750mg  IV every 12 hours.  Goal trough 10-15 mcg/mL.  Follow c/s, clinical progression, renal function, trough PRN  Height: 5\' 3"  (160 cm) Weight: 172 lb (78 kg) IBW/kg (Calculated) : 52.4  Temp (24hrs), Avg:98.8 F (37.1 C), Min:98.8 F (37.1 C), Max:98.8 F (37.1 C)   Recent Labs Lab 12/26/16 1205 12/26/16 1226  WBC 15.5*  --   CREATININE 0.62  --   LATICACIDVEN  --  0.95    Estimated Creatinine Clearance: 71.1 mL/min (by C-G formula based on SCr of 0.62 mg/dL).    Allergies  Allergen Reactions  . Penicillins Anaphylaxis  . Sulfa Antibiotics Hives    Antimicrobials this admission: vancomycin 4/18 >>   Dose adjustments this admission: n/a  Microbiology results: 4/18 BCx:    Thank you for allowing pharmacy to be a part of this patient's care.  Nesa Distel D. [place]o [place], PharmD, BCPS Clinical Pharmacist Pager: [phone] 12/26/2016 3:32 PM

## 2016-12-26 NOTE — ED Triage Notes (Signed)
Pt has partial amputation of the right foot. She reports she has a non healing ulcer on the foot, minimal drainage today. She denies hx of diabetes.

## 2016-12-27 DIAGNOSIS — M86271 Subacute osteomyelitis, right ankle and foot: Secondary | ICD-10-CM | POA: Diagnosis not present

## 2016-12-27 DIAGNOSIS — R6510 Systemic inflammatory response syndrome (SIRS) of non-infectious origin without acute organ dysfunction: Secondary | ICD-10-CM | POA: Diagnosis not present

## 2016-12-27 DIAGNOSIS — E871 Hypo-osmolality and hyponatremia: Secondary | ICD-10-CM | POA: Diagnosis not present

## 2016-12-27 DIAGNOSIS — L03115 Cellulitis of right lower limb: Secondary | ICD-10-CM | POA: Diagnosis not present

## 2016-12-27 LAB — BASIC METABOLIC PANEL
Anion gap: 12 (ref 5–15)
BUN: <5 mg/dL — ABNORMAL LOW (ref 6–20)
CHLORIDE: 100 mmol/L — AB (ref 101–111)
CO2: 24 mmol/L (ref 22–32)
CREATININE: 0.56 mg/dL (ref 0.44–1.00)
Calcium: 8.4 mg/dL — ABNORMAL LOW (ref 8.9–10.3)
GFR calc non Af Amer: >60 mL/min (ref >60)
Glucose, Bld: 92 mg/dL (ref 65–99)
Potassium: 3.6 mmol/L (ref 3.5–5.1)
Sodium: 136 mmol/L (ref 135–145)

## 2016-12-27 LAB — CBC
HEMATOCRIT: 27.4 % — AB (ref 36.0–46.0)
HEMOGLOBIN: 8.8 g/dL — AB (ref 12.0–15.0)
MCH: 30.9 pg (ref 26.0–34.0)
MCHC: 32.1 g/dL (ref 30.0–36.0)
MCV: 96.1 fL (ref 78.0–100.0)
PLATELETS: 351 10*3/uL (ref 150–400)
RBC: 2.85 MIL/uL — AB (ref 3.87–5.11)
RDW: 15 % (ref 11.5–15.5)
WBC: 9.4 10*3/uL (ref 4.0–10.5)

## 2016-12-27 LAB — HIV ANTIBODY (ROUTINE TESTING W REFLEX): HIV Screen 4th Generation wRfx: NONREACTIVE

## 2016-12-27 MED ORDER — DIPHENOXYLATE-ATROPINE 2.5-0.025 MG PO TABS
1.0000 | ORAL_TABLET | Freq: Four times a day (QID) | ORAL | Status: DC | PRN
Start: 2016-12-27 — End: 2016-12-29
  Administered 2016-12-27 – 2016-12-28 (×3): 1 via ORAL
  Filled 2016-12-27 (×3): qty 1

## 2016-12-27 NOTE — Progress Notes (Signed)
PROGRESS NOTE   Natalie Hampton  FGH:[id number]    DOB: 1951/06/18    DOA: 12/26/2016  PCP: Velna Hatchet, MD   I have briefly reviewed patients previous medical records in [facility].  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 Sharol Given, anxiety & depression, GERD, hypothyroid, IBS/chronic diarrhea, presented to the ED with 3 days history of new onset wound over her right foot followed by redness, pain and swelling. Denies bleeding or discharge from the site. Denies trauma and ambulates with cloggs. Admitted for right foot cellulitis with ulcer.   Assessment & Plan:   Principal Problem:   Cellulitis of leg Active Problems:   Fibromyalgia   Chronic fatigue   Right foot ulcer, with fat layer exposed ([place])   Hyponatremia   SIRS (systemic inflammatory response syndrome) (HCC)   Cellulitis of right lower leg   1. Right foot ulcer with cellulitis: X-ray of foot without cellulitis. Empirically started on IV vancomycin. Wound care consultation appreciated. Improving. Continue additional 24 hours of antibiotics and then consider discharging on oral antibiotics. Patient met SIRS criteria on admission. Blood cultures 2: No growth to date. 2. Anemia: Likely chronic now worsened by hemodilution. No bleeding reported. Follow CBC in a.m. Transfuse if hemoglobin <7 g per DL. 3. Hyponatremia: Resolved. 4. Hypothyroid: Synthroid 5. Chronic pain syndrome/fibromyalgia: Continue home medications. Controlled. 6. GERD: PPI 7. IBS/chronic diarrhea: Imodium per home regimen.   DVT prophylaxis: Lovenox Code Status: Full Family Communication: Discussed extensively with spouse at bedside. Disposition: DC home possibly 4/20.   Consultants:  None   Procedures:  None  Antimicrobials:  IV  vancomycin    Subjective: Feels better with improvement in swelling and redness. No pain reported. No drainage.   ROS: Denies trauma to the foot.  Objective:  Vitals:   12/26/16 2154 12/27/16 0503 12/27/16 0506 12/27/16 1536  BP: 126/68 (!) 146/65 (!) 128/92 (!) 149/73  Pulse: 96 91 94 (!) 102  Resp: '16 16  18  '$ Temp: 98.7 F (37.1 C) 98.2 F (36.8 C)  99.1 F (37.3 C)  TempSrc:    Oral  SpO2: 96% 97%  95%  Weight:      Height:        Examination:  General exam: Pleasant middle-aged female sitting up comfortably in bed this morning. Does not look septic or toxic. Respiratory system: Clear to auscultation. Respiratory effort normal. Cardiovascular system: S1 & S2 heard, RRR. No JVD, murmurs, rubs, gallops or clicks. No pedal edema. Gastrointestinal system: Abdomen is nondistended, soft and nontender. No organomegaly or masses felt. Normal bowel sounds heard. Central nervous system: Alert and oriented. No focal neurological deficits. Extremities: Symmetric 5 x 5 power. Skin: No rashes, lesions or ulcers. Full-thickness foot ulceration and previous surgical amputation site. No drainage. Some slough present. Patchy erythema surrounding the ulcer extending up to the lower leg which is improving. Mild asymmetric increase in warmth. No crepitus or fluctuation. Psychiatry: Judgement and insight appear normal. Mood & affect appropriate.       Data Reviewed: I have personally reviewed following labs and imaging studies  CBC:  Recent Labs Lab 12/26/16 1205 12/27/16 0436  WBC 15.5* 9.4  HGB 10.0* 8.8*  HCT 31.0* 27.4*  MCV 95.7 96.1  PLT 397 678   Basic Metabolic Panel:  Recent Labs Lab 12/26/16 1205 12/27/16 0436  NA 131* 136  K 4.2 3.6  CL 96* 100*  CO2 26 24  GLUCOSE 106* 92  BUN 7 <5*  CREATININE 0.62 0.56  CALCIUM 8.7* 8.4*   Liver Function Tests:  Recent Labs Lab 12/26/16 1205  AST 15  ALT 15  ALKPHOS 98  BILITOT 0.1*  PROT 6.7  ALBUMIN 3.0*      Recent Results (from the past 240 hour(s))  Blood culture (routine x 2)     Status: None (Preliminary result)   Collection Time: 12/26/16  3:44 PM  Result Value Ref Range Status   Specimen Description BLOOD RIGHT ANTECUBITAL  Final   Special Requests   Final    BOTTLES DRAWN AEROBIC AND ANAEROBIC Blood Culture adequate volume   Culture NO GROWTH < 24 HOURS  Final   Report Status PENDING  Incomplete  Blood culture (routine x 2)     Status: None (Preliminary result)   Collection Time: 12/26/16  3:49 PM  Result Value Ref Range Status   Specimen Description BLOOD LEFT ANTECUBITAL  Final   Special Requests   Final    BOTTLES DRAWN AEROBIC AND ANAEROBIC Blood Culture adequate volume   Culture NO GROWTH < 24 HOURS  Final   Report Status PENDING  Incomplete         Radiology Studies: Dg Foot Complete Right  Result Date: 12/26/2016 CLINICAL DATA:  Partial amputation of the right foot. Nonhealing ulcer medial right foot with drainage. EXAM: RIGHT FOOT COMPLETE - 3+ VIEW COMPARISON:  None. FINDINGS: First ray amputation. Soft tissue swelling is seen along the medial and plantar aspects of the foot with a lucent ulcer along the plantar aspect of the dorsal forefoot. No underlying osseous erosion to suggest osteomyelitis. Irregularity along the head of the second proximal phalanx is seen. Old fifth metatarsal fracture. Degenerative changes in the midfoot which may be Charcot in nature. IMPRESSION: 1. Marked soft tissue swelling along the medial and plantar aspects of the foot, with an associated superficial ulceration. No osseous erosion to suggest osteomyelitis. 2. Irregularity of the second proximal phalangeal head may be degenerative. Please correlate clinically. 3. 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 Electronically Signed   By: Lorin Picket M.D.   On: 12/26/2016 12:39        Scheduled Meds: . ALPRAZolam  1 mg Oral QID  . buPROPion  300 mg Oral Daily  .  carisoprodol  350 mg Oral TID  . enoxaparin (LOVENOX) injection  40 mg Subcutaneous Q24H  . estradiol  1 mg Oral Daily  . gabapentin  600 mg Oral TID  . levothyroxine  50 mcg Oral QAC breakfast  . multivitamin with minerals  1 tablet Oral Daily  . pantoprazole  40 mg Oral Daily  . sertraline  50 mg Oral Daily   Continuous Infusions: . sodium chloride 100 mL/hr at 12/27/16 0631  . vancomycin Stopped (12/27/16 0537)     LOS: 1 day     HONGALGI,ANAND, MD, FACP, FHM. [facility] Pager [phone]  If 7PM-7AM, please contact night-coverage [email or web address] Password [facility] 12/27/2016, 4:36 PM

## 2016-12-27 NOTE — Consult Note (Signed)
[place] Nurse wound consult note Reason for Consult:  Right foot wound, history of amputation of the great toe in 2015.  Patient reports onset of skin changes, swelling, redness 3 days ago.  Reports normal skin until then. Patient reports she would prefer to not have Dr. Sharol Given consulted for the foot wound.  Wound type: cellulitis with full thickness foot ulceration at previous surgical amputation site.  Pressure Injury POA: No Measurement: 5.5cm x 5.0cm x 0.2cm opening just centrally Wound bed:90% yellow/10% red. fluctuant centrally Drainage (amount, consistency, odor) not able to assess, no dressing in place. Not able to express drainage with palpation Periwound: erythema on the dorsal foot, pulse palpable, slightly edematous  Dressing procedure/placement/frequency: Will add silver hydrofiber for drainage and antimicrobial effect. Cover with dry dressing.  Needs offloading shoe, noted to be ordered just after my assessment by primary MD.  Needs outpatient follow up in wound care center of her choice within a week of DC. Would benefit from [facility] to monitor status of the wound as well, not sure how well patient is able to care for wound at home.    Discussed POC with patient and bedside nurse.  Re consult if needed, will not follow at this time. Thanks  Laniah Grimm [employer], RN,CWOCN, CNS [phone])

## 2016-12-27 NOTE — Evaluation (Addendum)
Physical Therapy Evaluation Patient Details Name: Natalie Hampton MRN: [id number] DOB: 09-10-1951 Today's Date: 12/27/2016   History of Present Illness   66 y.o. 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 Sharol Given who presented to the ED with 3 day history of redness and swelling in her right foot.  Dx of SIRS, R foot cellulitis, hyponatremia.   Clinical Impression  Pt admitted with above diagnosis. Pt currently with functional limitations due to the deficits listed below (see PT Problem List). Min/guard assist for stand pivot transfer to recliner. R foot wound is undressed and pt has no post op shoe, so did not attempt ambulation. Requested order for postop shoe. Pain limits activity tolerance, pain meds requested.  Pt will benefit from skilled PT to increase their independence and safety with mobility to allow discharge to the venue listed below.       Follow Up Recommendations Home health PT    Equipment Recommendations  Other (comment) (post op shoe)    Recommendations for Other Services       Precautions / Restrictions Precautions Precautions: Fall Precaution Comments: R foot wound Restrictions Weight Bearing Restrictions: No      Mobility  Bed Mobility Overal bed mobility: Modified Independent             General bed mobility comments: HOB up 35*, used bedrails  Transfers Overall transfer level: Needs assistance Equipment used: None Transfers: Sit to/from [facility] Sit to Stand: Min guard Stand pivot transfers: Min guard       General transfer comment: pivoted on LLE with min/guard A for safety, used bedrail and recliner armrests. Pt has no dressing on R foot wound which is actively draining, she has no post op shoe (will request one), so did not attempt [place]  on RLE.   Ambulation/Gait                Stairs            Wheelchair Mobility    Modified Rankin (Stroke Patients Only)       Balance Overall balance assessment: Needs assistance   Sitting balance-Leahy Scale: Good       Standing balance-Leahy Scale: Fair                               Pertinent Vitals/Pain Pain Assessment: 0-10 Pain Score: 7  Pain Location: RLE Pain Descriptors / Indicators: Sore Pain Intervention(s): Limited activity within patient's tolerance;Monitored during session;Patient requesting pain meds-RN notified    Home Living Family/patient expects to be discharged to:: Private residence Living Arrangements: Spouse/significant other Available Help at Discharge: Family;Available 24 hours/day   Home Access: Ramped entrance     Home Layout: One level Home Equipment: Shower seat - built in      Prior Function Level of Independence: Independent with assistive device(s)         Comments: walks without AD usually, at times uses RW, denies falls in past 1 year, independent ADLs, doesn't drive but her husband does     Hand Dominance        Extremity/Trunk Assessment   Upper Extremity Assessment Upper Extremity Assessment: Overall WFL for tasks assessed    Lower Extremity Assessment Lower Extremity Assessment: RLE deficits/detail RLE Deficits / Details: decreased sensation to light touch in  remaining toes, able to actively DF/PF but range limited 50% by pain, SLR 3/5 RLE: Unable to fully assess due to pain    Cervical / Trunk Assessment Cervical / Trunk Assessment: Normal  Communication   Communication: No difficulties  Cognition Arousal/Alertness: Awake/alert Behavior During Therapy: WFL for tasks assessed/performed Overall Cognitive Status: Within Functional Limits for tasks assessed                                        General Comments      Exercises     Assessment/Plan    PT  Assessment Patient needs continued PT services  PT Problem List Decreased strength;Decreased activity tolerance;Pain;Decreased balance;Decreased mobility       PT Treatment Interventions DME instruction;Gait training;Functional mobility training;Therapeutic exercise;Patient/family education;Therapeutic activities    PT Goals (Current goals can be found in the Care Plan section)  Acute Rehab PT Goals Patient Stated Goal: decrease pain, return home PT Goal Formulation: With patient Time For Goal Achievement: 01/10/17 Potential to Achieve Goals: Good    Frequency Min 3X/week   Barriers to discharge        Co-evaluation               End of Session Equipment Utilized During Treatment: Gait belt Activity Tolerance: Patient limited by pain Patient left: in chair;with call bell/phone within reach Nurse Communication: Mobility status PT Visit Diagnosis: Pain;Difficulty in walking, not elsewhere classified (R26.2) Pain - Right/Left: Right Pain - part of body: Ankle and joints of foot    Time: 6834-1962 PT Time Calculation (min) (ACUTE ONLY): 19 min   Charges:   PT Evaluation $PT Eval Low Complexity: 1 Procedure     PT G Codes:   PT G-Codes **NOT FOR INPATIENT CLASS**  Functional Assessment Tool Used AM-PAC 6 Clicks Basic Mobility byJenniferKUhlenberg,PT at04/19/181041     Functional Limitation Mobility: Walking and moving around byJenniferKUhlenberg,PT at04/19/181041     Mobility: Walking and Moving Around Current Status (I2979) At least 40 percent but less than 60 percent impaired, limited or restricted byJenniferKUhlenberg,PT at04/19/181041     Mobility: Walking and Moving Around Goal Status [phone]) At least 20 percent but less than 40 percent impaired, limited or restricted byJenniferKUhlenberg,PT at04/19/181041     Mobility: Walking and Moving Around Discharge Status [phone])              Philomena Doheny 12/27/2016, 10:42  AM [phone]

## 2016-12-27 NOTE — Progress Notes (Signed)
Orthopedic Tech Progress Note Patient Details:  Natalie Hampton 13-Jan-1951 [id number]  Ortho Devices Type of Ortho Device: Postop shoe/boot Ortho Device/Splint Location: rle Ortho Device/Splint Interventions: Application   [name] 12/27/2016, 11:29 AM

## 2016-12-28 DIAGNOSIS — M86271 Subacute osteomyelitis, right ankle and foot: Secondary | ICD-10-CM

## 2016-12-28 DIAGNOSIS — L03115 Cellulitis of right lower limb: Secondary | ICD-10-CM | POA: Diagnosis not present

## 2016-12-28 LAB — CBC
HEMATOCRIT: 28.7 % — AB (ref 36.0–46.0)
Hemoglobin: 9.1 g/dL — ABNORMAL LOW (ref 12.0–15.0)
MCH: 30.7 pg (ref 26.0–34.0)
MCHC: 31.7 g/dL (ref 30.0–36.0)
MCV: 97 fL (ref 78.0–100.0)
PLATELETS: 404 10*3/uL — AB (ref 150–400)
RBC: 2.96 MIL/uL — ABNORMAL LOW (ref 3.87–5.11)
RDW: 15.1 % (ref 11.5–15.5)
WBC: 12.1 10*3/uL — AB (ref 4.0–10.5)

## 2016-12-28 NOTE — Progress Notes (Signed)
Daily dressing changed to right foot as ordered,tolerated well. Dr. Sharol Given in to assess right foot before dressing changed.

## 2016-12-28 NOTE — Progress Notes (Signed)
PROGRESS NOTE   Natalie Hampton  ACZ:[id number]    DOB: [birth year]    DOA: 12/26/2016  PCP: Velna Hatchet, MD   I have briefly reviewed patients previous medical records in [facility].  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 Sharol Given, anxiety & depression, GERD, hypothyroid, IBS/chronic diarrhea, presented to the ED with 3 days history of new onset wound over her right foot followed by redness, pain and swelling. Denies bleeding or discharge from the site. Denies trauma and ambulates with cloggs. Admitted for right foot cellulitis with ulcer.   Assessment & Plan:   Principal Problem:   Cellulitis of leg Active Problems:   Fibromyalgia   Chronic fatigue   Right foot ulcer, with fat layer exposed ([place])   Hyponatremia   SIRS (systemic inflammatory response syndrome) (HCC)   Cellulitis of right lower leg   1. Right foot ulcer with cellulitis: X-ray of foot without cellulitis. Empirically started on IV vancomycin. Wound care consultation appreciated. Improving. On 4/20, although most of the cellulitis features are improving, concern re fluctuance/abscess under the wound site & hence consulted Orthopedics and continue additional 24 hours of antibiotics. Patient met SIRS criteria on admission. Blood cultures 2: No growth to date. 2. Anemia: Likely chronic now worsened by hemodilution. No bleeding reported. Stable. 3. Hyponatremia: Resolved. 4. Hypothyroid: Synthroid 5. Chronic pain syndrome/fibromyalgia: Continue home medications. Controlled. 6. GERD: PPI 7. IBS/chronic diarrhea: Imodium per home regimen.   DVT prophylaxis: Lovenox Code Status: Full Family Communication: Discussed extensively with spouse at bedside. Disposition: DC home possibly 4/21 pending Ortho  input   Consultants:  Orthopedics  Procedures:  None  Antimicrobials:  IV vancomycin    Subjective: Continues to feel better with improvement in swelling and redness > not fully back to [place]. No pain reported. No drainage.   ROS: Denies trauma to the foot.  Objective:  Vitals:   12/27/16 1536 12/27/16 2202 12/28/16 0710 12/28/16 1428  BP: (!) 149/73 (!) 121/59 (!) 124/58 130/70  Pulse: (!) 102 95 82 88  Resp: '18 17 18 18  '$ Temp: 99.1 F (37.3 C) 98.7 F (37.1 C) 98.4 F (36.9 C) 98.3 F (36.8 C)  TempSrc: Oral     SpO2: 95% 97% 99% 98%  Weight:      Height:        Examination:  General exam: Pleasant middle-aged female sitting up comfortably in bed this morning. Does not look septic or toxic. Respiratory system: Clear to auscultation. Respiratory effort normal. Cardiovascular system: S1 & S2 heard, RRR. No JVD, murmurs, rubs, gallops or clicks. No pedal edema. Gastrointestinal system: Abdomen is nondistended, soft and nontender. No organomegaly or masses felt. Normal bowel sounds heard. Central nervous system: Alert and oriented. No focal neurological deficits. Extremities: Symmetric 5 x 5 power. Skin: No rashes, lesions or ulcers. Full-thickness foot ulceration and previous surgical amputation site. Minimal purulent drainage. Some slough present. Patchy erythema surrounding the ulcer extending up to the lower leg which is improving. Mild asymmetric increase in warmth. No crepitus or fluctuation. ?Fluctuance under wound site Psychiatry: Judgement and insight appear normal. Mood & affect appropriate.       Data Reviewed: I have personally reviewed following labs and imaging studies  CBC:  Recent Labs Lab 12/26/16 1205 12/27/16 0436 12/28/16 0503  WBC 15.5* 9.4 12.1*  HGB 10.0*  8.8* 9.1*  HCT 31.0* 27.4* 28.7*  MCV 95.7 96.1 97.0  PLT 397 351 001*   Basic Metabolic Panel:  Recent Labs Lab 12/26/16 1205 12/27/16 0436  NA 131* 136  K 4.2 3.6   CL 96* 100*  CO2 26 24  GLUCOSE 106* 92  BUN 7 <5*  CREATININE 0.62 0.56  CALCIUM 8.7* 8.4*   Liver Function Tests:  Recent Labs Lab 12/26/16 1205  AST 15  ALT 15  ALKPHOS 98  BILITOT 0.1*  PROT 6.7  ALBUMIN 3.0*     Recent Results (from the past 240 hour(s))  Blood culture (routine x 2)     Status: None (Preliminary result)   Collection Time: 12/26/16  3:44 PM  Result Value Ref Range Status   Specimen Description BLOOD RIGHT ANTECUBITAL  Final   Special Requests   Final    BOTTLES DRAWN AEROBIC AND ANAEROBIC Blood Culture adequate volume   Culture NO GROWTH < 24 HOURS  Final   Report Status PENDING  Incomplete  Blood culture (routine x 2)     Status: None (Preliminary result)   Collection Time: 12/26/16  3:49 PM  Result Value Ref Range Status   Specimen Description BLOOD LEFT ANTECUBITAL  Final   Special Requests   Final    BOTTLES DRAWN AEROBIC AND ANAEROBIC Blood Culture adequate volume   Culture NO GROWTH < 24 HOURS  Final   Report Status PENDING  Incomplete         Radiology Studies: No results found.      Scheduled Meds: . ALPRAZolam  1 mg Oral QID  . buPROPion  300 mg Oral Daily  . carisoprodol  350 mg Oral TID  . enoxaparin (LOVENOX) injection  40 mg Subcutaneous Q24H  . estradiol  1 mg Oral Daily  . gabapentin  600 mg Oral TID  . levothyroxine  50 mcg Oral QAC breakfast  . multivitamin with minerals  1 tablet Oral Daily  . pantoprazole  40 mg Oral Daily  . sertraline  50 mg Oral Daily   Continuous Infusions: . vancomycin Stopped (12/28/16 0518)     LOS: 2 days     Eeva Schlosser, MD, FACP, FHM. [facility] Pager [phone]  If 7PM-7AM, please contact night-coverage [email or web address] Password [facility] 12/28/2016, 4:39 PM

## 2016-12-28 NOTE — Care Management Note (Signed)
Case Management Note  Patient Details  Name: Natalie Hampton MRN: [id number] Date of Birth: 1951-01-25  Subjective/Objective:                 Spoke with patient at the bedside. She is from home with herhusband. She states thast was a trainer for [employer] and she feels he is more than able to care for her dressing at home. She declined [place] RN. She states that she does not want HH PT. Reveiewed with patient that if she changes her mind prior to DC we would happy to set it up, and if she decides she wants it after she is home her PCP office can arrange as well. She declined DME needs, states she has a RW and WC.    Action/Plan:  No CM needs identified at this time.   Expected Discharge Date:                  Expected Discharge Plan:  Home/Self Care  In-House Referral:     Discharge planning Services  CM Consult  Post Acute Care Choice:    Choice offered to:     DME Arranged:    DME Agency:     HH Arranged:    HH Agency:     Status of Service:  In process, will continue to follow  If discussed at Long Length of Stay Meetings, dates discussed:    Additional Comments:  Carles Collet, RN 12/28/2016, 2:51 PM

## 2016-12-28 NOTE — Progress Notes (Signed)
Physical Therapy Treatment Patient Details Name: Natalie Hampton MRN: [id number] DOB: 11/24/1950 Today's Date: 12/28/2016    History of Present Illness  66 y.o. 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 Sharol Given who presented to the ED with 3 day history of redness and swelling in her right foot.  Dx of SIRS, R foot cellulitis, hyponatremia.     PT Comments    Pt demo improved tolerance for gait this session but is still limited by fatigue. Pt will benefit from an additional therapy visit if not DC'd to progress gait and review written HEP. Pt will still benefit from HHPT at discharge.     Follow Up Recommendations  Home health PT     Equipment Recommendations       Recommendations for Other Services       Precautions / Restrictions Precautions Precautions: Fall Precaution Comments: R foot wound Restrictions Weight Bearing Restrictions: No    Mobility  Bed Mobility Overal bed mobility: Modified Independent             General bed mobility comments: HOB up 35*, used bedrails  Transfers Overall transfer level: Needs assistance Equipment used: None Transfers: Sit to/from Stand Sit to Stand: Min guard         General transfer comment: Min guard for safety. Has been mobilizing around room.   Ambulation/Gait Ambulation/Gait assistance: Min guard Ambulation Distance (Feet): 20 Feet Assistive device: Rolling walker (2 wheeled) Gait Pattern/deviations: Step-through pattern;Antalgic Gait velocity: decreased Gait velocity interpretation: Below normal speed for age/gender General Gait Details: moderate antalgic gait with post op shoe on RLE. becomes fatigued after 10;   Stairs            Wheelchair Mobility    Modified Rankin (Stroke Patients Only)       Balance                                             Cognition Arousal/Alertness: Awake/alert Behavior During Therapy: WFL for tasks assessed/performed Overall Cognitive Status: Within Functional Limits for tasks assessed                                        Exercises General Exercises - Lower Extremity Ankle Circles/Pumps: AROM;Both;10 reps;Supine Quad Sets: AROM;Both;10 reps;Supine Gluteal Sets: AROM;Both;10 reps;Supine Heel Slides: AROM;Both;10 reps;Supine    General Comments        Pertinent Vitals/Pain Pain Assessment: 0-10 Pain Score: 5  Pain Location: RLE Pain Descriptors / Indicators: Sore    Home Living                      Prior Function            PT Goals (current goals can now be found in the care plan section) Acute Rehab PT Goals Patient Stated Goal: decrease pain, return home Progress towards PT goals: Progressing toward goals    Frequency    Min 3X/week      PT Plan Current plan remains appropriate    Co-evaluation             End of Session Equipment Utilized During Treatment:  Gait belt Activity Tolerance: Patient limited by fatigue Patient left: in bed;with call bell/phone within reach Nurse Communication: Mobility status PT Visit Diagnosis: Pain;Difficulty in walking, not elsewhere classified (R26.2) Pain - Right/Left: Right Pain - part of body: Ankle and joints of foot     Time: 6067-7034 PT Time Calculation (min) (ACUTE ONLY): 10 min  Charges:  $Therapeutic Exercise: 8-22 mins                    G Codes:       Scheryl Marten PT, DPT  [phone]    Shanon Rosser 12/28/2016, 2:13 PM

## 2016-12-28 NOTE — Consult Note (Signed)
ORTHOPAEDIC CONSULTATION  REQUESTING PHYSICIAN: Modena Jansky, MD  Chief Complaint: Abscess ulceration medial column right foot.  HPI: Natalie Hampton is a 66 y.o. female who presents with abscess ulceration medial column right foot. 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 Recently she's been having drainage ulceration and swelling. Patient denies any problems with the left foot.  Past Medical History:  Diagnosis Date  . Anxiety   . Arthritis   . Bladder cancer ([place]) 20 years ago  . Chronic fatigue   . Depression   . Fibromyalgia   . GERD (gastroesophageal reflux disease)   . History of hiatal hernia   . Hypothyroidism   . IBS (irritable bowel syndrome)   . PONV (postoperative nausea and vomiting)   . Spina bifida ([place])   . Urinary incontinence    From bladder cancer and radiation   Past Surgical History:  Procedure Laterality Date  . ABDOMINAL HYSTERECTOMY    . AMPUTATION Right 08/18/2014   Procedure: 1st Ray Amputation;  Surgeon: Newt Minion, MD;  Location: [facility];  Service: Orthopedics;  Laterality: Right;  . ANKLE SURGERY    . APPENDECTOMY    . CHOLECYSTECTOMY    . COLONOSCOPY    . ESOPHAGOGASTRODUODENOSCOPY    . EYE SURGERY Bilateral    "almost" retinal detachment  . FOOT SURGERY Right    Pt reports about 15 surgeries on right ankle & foot.   Social History   Social History  . Marital status: Married    Spouse name: N/A  . Number of children: N/A  . Years of education: N/A   Social History Main Topics  . Smoking status: Never Smoker  . Smokeless tobacco: Never Used  . Alcohol use Yes     Comment: rare  . Drug use: No  . Sexual activity: Not Asked   Other Topics Concern  . None   Social History Narrative  . None   History reviewed. No pertinent family history. - negative except otherwise stated in the family history section Allergies  Allergen Reactions  . Penicillins Anaphylaxis  . Sulfa Antibiotics Hives    Prior to Admission medications   Medication Sig Start Date End Date Taking? Authorizing Provider  ALPRAZolam Duanne Moron) 1 MG tablet Take 1 mg by mouth 4 (four) times daily. 11/28/16  Yes Historical Provider, MD  buPROPion (WELLBUTRIN XL) 300 MG 24 hr tablet Take 300 mg by mouth daily.  03/09/14  Yes Historical Provider, MD  carisoprodol (SOMA) 350 MG tablet Take 350 mg by mouth 3 (three) times daily.   Yes Historical Provider, MD  diphenoxylate-atropine (LOMOTIL) 2.5-0.025 MG tablet Take 1 tablet by mouth daily as needed for diarrhea or loose stools. 12/04/16  Yes Historical Provider, MD  estradiol (ESTRACE) 1 MG tablet Take 1 mg by mouth daily.  03/09/14  Yes Historical Provider, MD  gabapentin (NEURONTIN) 600 MG tablet Take 600 mg by mouth 3 (three) times daily.  03/09/14  Yes Historical Provider, MD  HYDROcodone-acetaminophen (NORCO) 10-325 MG tablet Take 1 tablet by mouth every 6 (six) hours as needed for pain. 12/06/16  Yes Historical Provider, MD  levothyroxine (SYNTHROID, LEVOTHROID) 50 MCG tablet Take 50 mcg by mouth daily.  03/09/14  Yes Historical Provider, MD  Multiple Vitamin (MULTIVITAMIN WITH MINERALS) TABS tablet Take 1 tablet by mouth daily.   Yes Historical Provider, MD  omeprazole (PRILOSEC) 40 MG capsule Take 40 mg by mouth daily.  03/09/14  Yes Historical Provider,  MD  sertraline (ZOLOFT) 50 MG tablet Take 50 mg by mouth daily. 11/30/16  Yes Historical Provider, MD  Tetrahydrozoline HCl (VISINE OP) Place 1 drop into both eyes daily as needed (dry eyes).   Yes Historical Provider, MD  doxycycline (VIBRAMYCIN) 100 MG capsule Take 1 capsule (100 mg total) by mouth 2 (two) times daily. Patient not taking: Reported on 12/26/2016 03/28/14   Thurnell Lose, MD   No results found. - pertinent xrays, CT, MRI studies were reviewed and independently interpreted  Positive ROS: All other systems have been reviewed and were otherwise negative with the exception of those mentioned in the HPI and as  above.  Physical Exam: General: Alert, no acute distress Psychiatric: Patient is competent for consent with normal mood and affect Lymphatic: No axillary or cervical lymphadenopathy Cardiovascular: No pedal edema Respiratory: No cyanosis, no use of accessory musculature GI: No organomegaly, abdomen is soft and non-tender  Skin: Patient has a necrotic ulcer over the medial column right foot. The ulcer probes to bone. There is no ascending cellulitis.   Neurologic: Patient does not have protective sensation bilateral lower extremities.   MUSCULOSKELETAL:  Radiographs show no destructive bony changes. Patient has a necrotic ulcer which probes to bone over the medial column. Patient has a good dorsalis pedis pulse. There is no ascending cellulitis.  Assessment: Assessment: Acute osteomyelitis with necrotic ulcer and abscess medial column right foot. .  Plan: Plan: Patient states that her insurance has not authorized her as an inpatient. In light of this information patient could be discharged to home with oral antibiotics, dry dressing changes daily, follow-up in my office on Monday and scheduled for surgical intervention for excision of the medial cuneiform and excision of the ulcerative tissue as an outpatient surgery next week.  Thank you for the consult and the opportunity to see Ms. [place], [place] [phone] 5:54 PM

## 2016-12-28 NOTE — Care Management Obs Status (Signed)
[place] NOTIFICATION   Patient Details  Name: Natalie Hampton MRN: [id number] Date of Birth: 1951-08-02   Medicare Observation Status Notification Given:  Yes    Carles Collet, RN 12/28/2016, 4:06 PM

## 2016-12-28 NOTE — Care Management CC44 (Signed)
Condition Code 44 Documentation Completed  Patient Details  Name: Natalie Hampton MRN: [id number] Date of Birth: Jan 15, 1951   Condition Code 44 given:  Yes Patient signature on Condition Code 44 notice:  Yes Documentation of 2 MD's agreement:  Yes Code 44 added to claim:  Yes    Carles Collet, RN 12/28/2016, 4:06 PM

## 2016-12-29 DIAGNOSIS — L03115 Cellulitis of right lower limb: Secondary | ICD-10-CM | POA: Diagnosis not present

## 2016-12-29 DIAGNOSIS — E871 Hypo-osmolality and hyponatremia: Secondary | ICD-10-CM | POA: Diagnosis not present

## 2016-12-29 DIAGNOSIS — M86271 Subacute osteomyelitis, right ankle and foot: Secondary | ICD-10-CM

## 2016-12-29 DIAGNOSIS — R6510 Systemic inflammatory response syndrome (SIRS) of non-infectious origin without acute organ dysfunction: Secondary | ICD-10-CM | POA: Diagnosis not present

## 2016-12-29 MED ORDER — DOXYCYCLINE HYCLATE 100 MG PO CAPS: 100.0000 mg | ORAL_CAPSULE | Freq: Two times a day (BID) | ORAL | 0 refills | Status: DC

## 2016-12-29 MED ORDER — DOXYCYCLINE HYCLATE 100 MG PO CAPS
100.0000 mg | ORAL_CAPSULE | Freq: Two times a day (BID) | ORAL | 0 refills | Status: DC
Start: 2016-12-29 — End: 2016-12-29

## 2016-12-29 NOTE — Progress Notes (Signed)
Patient was discharged home by MD order; discharged instructions review and give to patient with care notes; IV DIC;  patient will be escorted to the car by nurse tech via wheelchair.  

## 2016-12-29 NOTE — Discharge Summary (Signed)
Physician Discharge Summary  Natalie Hampton NLZ:[id number] DOB: [birth year]  PCP: Velna Hatchet, MD  Admit date: 12/26/2016 Discharge date: 12/29/2016  Recommendations for Outpatient Follow-up:  1. Dr. Velna Hatchet, PCP in 5 days with repeat labs (CBC & BMP). Please follow final blood cultures that were sent from the hospital. 2. Dr. Meridee Score, Orthopedics: patient is advised to call office on Mon 4/23 for follow up and possible surgical intervention on Right foot wound.  Home Health: Declines HHRN services. States that her spouse is an Statistician" in dressing the wound from prior experience. Equipment/Devices: None    Discharge Condition: Improved and stable.  CODE STATUS: Full  Diet recommendation: [facility] diet.  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 Sharol Given, anxiety & depression, GERD, hypothyroid, IBS/chronic diarrhea, presented to the ED with 3 days history of new onset wound over her right foot followed by redness, pain and swelling. Denies bleeding or discharge from the site. Denies trauma and ambulates with cloggs. Admitted for right foot cellulitis with ulcer.   Assessment & Plan:   1. Right foot ulcer with cellulitis/? Acute Osteomyleitis: X-ray of foot without OM. Empirically started on IV vancomycin. Wound care consultation appreciated. Improving. On 4/20, although most of the cellulitis features had improved, concern re fluctuance/abscess  under the wound site & hence consulted Orthopedics and continued additional 24 hours of antibiotics. Patient met SIRS criteria on admission. Blood cultures 2: No growth to date. Dr. Sharol Given input appreciated> indicates Acute OM with necrotic ulcer medical column of right foot and in light of her insurance not authorizing in patient care, he recommends DC on PO Abx, dry dressing change daily, follow up in office on 4/23 and schedule intervention for excision of the medial cuneiform and excision of the ulcerative tissue as OP surgery next week. Cellulitis has almost resolved. DC on additional 10 days of Doxy > can be reassessed by Ortho as OP.  2. Anemia: Likely chronic now worsened by hemodilution. No bleeding reported. Stable. OP follow up. 3. Hyponatremia: Resolved. 4. Hypothyroid: Synthroid 5. Chronic pain syndrome/fibromyalgia: Continue home medications. Controlled. 6. GERD: PPI 7. IBS/chronic diarrhea: Imodium per home regimen.   Consultants:  Orthopedics  Procedures:  None  Discharge Instructions  Discharge Instructions    Call MD for:  extreme fatigue    Complete by:  As directed    Call MD for:  persistant dizziness or light-headedness    Complete by:  As directed    Call MD for:  redness, tenderness, or signs of infection (pain, swelling, redness, odor or green/yellow discharge around incision site)    Complete by:  As directed    Call MD for:  severe uncontrolled pain    Complete by:  As directed    Call MD for:  temperature >100.4    Complete by:  As directed    Change dressing    Complete by:  As directed    Loss right foot with soap and water daily apply dry dressing. Weightbearing as  tolerated.   Diet - low sodium heart healthy    Complete by:  As directed    Discharge wound care:    Complete by:  As directed    dry dressing changes daily   Increase activity slowly    Complete by:  As directed        Medication List    TAKE these medications   ALPRAZolam 1 MG  tablet Commonly known as:  XANAX Take 1 mg by mouth 4 (four) times daily.   buPROPion 300 MG 24 hr tablet Commonly known as:  WELLBUTRIN XL Take 300 mg by mouth daily.   carisoprodol 350 MG tablet Commonly known as:  SOMA Take 350 mg by mouth 3 (three) times daily.   diphenoxylate-atropine 2.5-0.025 MG tablet Commonly known as:  LOMOTIL Take 1 tablet by mouth daily as needed for diarrhea or loose stools.   doxycycline 100 MG capsule Commonly known as:  VIBRAMYCIN Take 1 capsule (100 mg total) by mouth 2 (two) times daily.   estradiol 1 MG tablet Commonly known as:  ESTRACE Take 1 mg by mouth daily.   gabapentin 600 MG tablet Commonly known as:  NEURONTIN Take 600 mg by mouth 3 (three) times daily.   HYDROcodone-acetaminophen 10-325 MG tablet Commonly known as:  NORCO Take 1 tablet by mouth every 6 (six) hours as needed for pain.   levothyroxine 50 MCG tablet Commonly known as:  SYNTHROID, LEVOTHROID Take 50 mcg by mouth daily.   multivitamin with minerals Tabs tablet Take 1 tablet by mouth daily.   omeprazole 40 MG capsule Commonly known as:  PRILOSEC Take 40 mg by mouth daily.   sertraline 50 MG tablet Commonly known as:  ZOLOFT Take 50 mg by mouth daily.   VISINE OP Place 1 drop into both eyes daily as needed (dry eyes).      Follow-up Information    Newt Minion, MD Follow up in 1 week(s).   Specialty:  Orthopedic Surgery Why:  As per your discussion with Dr. Sharol Given, call his office on Mon 12/31/2016 for follow up appointment. Contact information: [place] [address] [phone]        Velna Hatchet, MD. Schedule an appointment as soon as possible for a visit in 5 day(s).   Specialty:  Internal Medicine Why:  to be seen with repeat labs (CBC & BMP). Contact information: [address] [place] [address] [phone]          Allergies  Allergen Reactions  . Penicillins Anaphylaxis  . Sulfa Antibiotics  Hives      Procedures/Studies: Dg Foot Complete Right  Result Date: 12/26/2016 CLINICAL DATA:  Partial amputation of the right foot. Nonhealing ulcer medial right foot with drainage. EXAM: RIGHT FOOT COMPLETE - 3+ VIEW COMPARISON:  None. FINDINGS: First ray amputation. Soft tissue swelling is seen along the medial and plantar aspects of the foot with a lucent ulcer along the plantar aspect of the dorsal forefoot. No underlying osseous erosion to suggest osteomyelitis. Irregularity along the head of the second proximal phalanx is seen. Old fifth metatarsal fracture. Degenerative changes in the midfoot which may be Charcot in nature. IMPRESSION: 1. Marked soft tissue swelling along the medial and plantar aspects of the foot, with an associated superficial ulceration. No osseous erosion to suggest osteomyelitis. 2. Irregularity of the second proximal phalangeal head may be degenerative. Please correlate clinically. 3. 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 Electronically Signed   By: Lorin Picket  M.D.   On: 12/26/2016 12:39      Subjective: No pain reported to me. States redness and swelling has almost resolved and that foot wound looked much better during dressing.  Discharge Exam:  Vitals:   12/28/16 0710 12/28/16 1428 12/28/16 2154 12/29/16 0545  BP: (!) 124/58 130/70 132/64 133/71  Pulse: 82 88 87 99  Resp: _0 Temp: 98.4 F (36.9 C) 98.3 F (36.8 C) 98.6 F (37 C) 98.7 F (37.1 C)  TempSrc:      SpO2: 99% 98% 97% 97%  Weight:      Height:        General exam: Pleasant middle-aged female sitting up comfortably in bed this morning. Does not look septic or toxic. Respiratory system: Clear to auscultation. Respiratory effort normal. Cardiovascular system: S1 & S2 heard, RRR. No JVD, murmurs, rubs, gallops or clicks. No pedal edema. Gastrointestinal system: Abdomen is nondistended, soft and nontender. No organomegaly or masses felt. Normal bowel sounds  heard. Central nervous system: Alert and oriented. No focal neurological deficits. Extremities: Symmetric 5 x 5 power. Skin: Full-thickness foot ulceration at previous surgical amputation site over anterior medial column of R foot. Minimal purulent drainage soaking dressing. Some slough present. Patchy erythema surrounding the ulcer extending up to the lower leg which has significantly improved/almost resolved. No increased warmth. ?Fluctuance under wound site Psychiatry: Judgement and insight appear normal. Mood & affect appropriate.     The results of significant diagnostics from this hospitalization (including imaging, microbiology, ancillary and laboratory) are listed below for reference.     Microbiology: Recent Results (from the past 240 hour(s))  Blood culture (routine x 2)     Status: None (Preliminary result)   Collection Time: 12/26/16  3:44 PM  Result Value Ref Range Status   Specimen Description BLOOD RIGHT ANTECUBITAL  Final   Special Requests   Final    BOTTLES DRAWN AEROBIC AND ANAEROBIC Blood Culture adequate volume   Culture NO GROWTH 3 DAYS  Final   Report Status PENDING  Incomplete  Blood culture (routine x 2)     Status: None (Preliminary result)   Collection Time: 12/26/16  3:49 PM  Result Value Ref Range Status   Specimen Description BLOOD LEFT ANTECUBITAL  Final   Special Requests   Final    BOTTLES DRAWN AEROBIC AND ANAEROBIC Blood Culture adequate volume   Culture NO GROWTH 3 DAYS  Final   Report Status PENDING  Incomplete     Labs: CBC:  Recent Labs Lab 12/26/16 1205 12/27/16 0436 12/28/16 0503  WBC 15.5* 9.4 12.1*  HGB 10.0* 8.8* 9.1*  HCT 31.0* 27.4* 28.7*  MCV 95.7 96.1 97.0  PLT 397 351 793*   Basic Metabolic Panel:  Recent Labs Lab 12/26/16 1205 12/27/16 0436  NA 131* 136  K 4.2 3.6  CL 96* 100*  CO2 26 24  GLUCOSE 106* 92  BUN 7 <5*  CREATININE 0.62 0.56  CALCIUM 8.7* 8.4*   Liver Function Tests:  Recent Labs Lab  12/26/16 1205  AST 15  ALT 15  ALKPHOS 98  BILITOT 0.1*  PROT 6.7  ALBUMIN 3.0*   Urinalysis    Component Value Date/Time   COLORURINE YELLOW 03/25/2014 1705   APPEARANCEUR TURBID (A) 03/25/2014 1705   LABSPEC 1.015 03/25/2014 1705   PHURINE 7.0 03/25/2014 1705   GLUCOSEU NEGATIVE 03/25/2014 1705   HGBUR SMALL (A) 03/25/2014 1705   BILIRUBINUR NEGATIVE 03/25/2014 1705   KETONESUR NEGATIVE 03/25/2014 1705  PROTEINUR NEGATIVE 03/25/2014 1705   UROBILINOGEN 0.2 03/25/2014 1705   NITRITE NEGATIVE 03/25/2014 1705   LEUKOCYTESUR LARGE (A) 03/25/2014 1705      Time coordinating discharge: Over 30 minutes  SIGNED:  Vernell Leep, MD, FACP, FHM. [facility] Pager [phone]  If 7PM-7AM, please contact night-coverage [email or web address] Password [facility] 12/29/2016, 12:17 PM

## 2016-12-31 LAB — CULTURE, BLOOD (ROUTINE X 2)
CULTURE: NO GROWTH
Culture: NO GROWTH
SPECIAL REQUESTS: ADEQUATE
SPECIAL REQUESTS: ADEQUATE

## 2017-01-01 ENCOUNTER — Ambulatory Visit (INDEPENDENT_AMBULATORY_CARE_PROVIDER_SITE_OTHER): Payer: Self-pay | Admitting: Orthopedic Surgery

## 2017-01-02 ENCOUNTER — Encounter: Payer: Self-pay | Admitting: Internal Medicine

## 2017-01-07 ENCOUNTER — Ambulatory Visit (INDEPENDENT_AMBULATORY_CARE_PROVIDER_SITE_OTHER): Payer: Self-pay | Admitting: Orthopedic Surgery

## 2017-02-12 ENCOUNTER — Encounter: Payer: Self-pay | Admitting: Adult Health

## 2017-02-12 ENCOUNTER — Encounter: Payer: Medicare Other | Admitting: Adult Health

## 2017-02-12 NOTE — Progress Notes (Signed)
   Subjective:    Patient ID: Natalie Hampton, female    DOB: 01-08-1951, 66 y.o.   MRN: [id number]  HPI:  Ms. [place]     Review of Systems     Objective:   Physical Exam        Assessment & Plan:    This encounter was created in error - please disregard.

## 2017-02-13 NOTE — Progress Notes (Signed)
Pt intended to est as a new pt. Has extensive hx: Fibromyalgia Chronic fatigue Hyponatremia SIRS Current cellulitis of R foot-being treated by Podiatrist We discussed care plan for > 45 mins and I explained to treat her chronic pain, course of PT, aqua therapy, referral to Pain Management- she has been Alprazolam 1 mg Q6H, Carisoprodol 350mg  TID, and Norco 10/325mg  TID > 10 years. She did not want CAM and only wanted RFs on meds, which I explained that our policy was to provide 5 d supply only once of narcotics and then care would need to be managed by Pain Management. She refused this approach and declined to est practice. I explained that if she changed her mind, we would be happy to manage her medical needs, etc.

## 2018-02-11 IMAGING — DX DG FOOT COMPLETE 3+V*R*
3 series · 3 of 3 positions shown · non-contrast
Comparison: None.

CLINICAL DATA: Partial amputation of the right foot. Nonhealing
ulcer medial right foot with drainage.

EXAM:
RIGHT FOOT COMPLETE - 3+ VIEW

[foot ap]
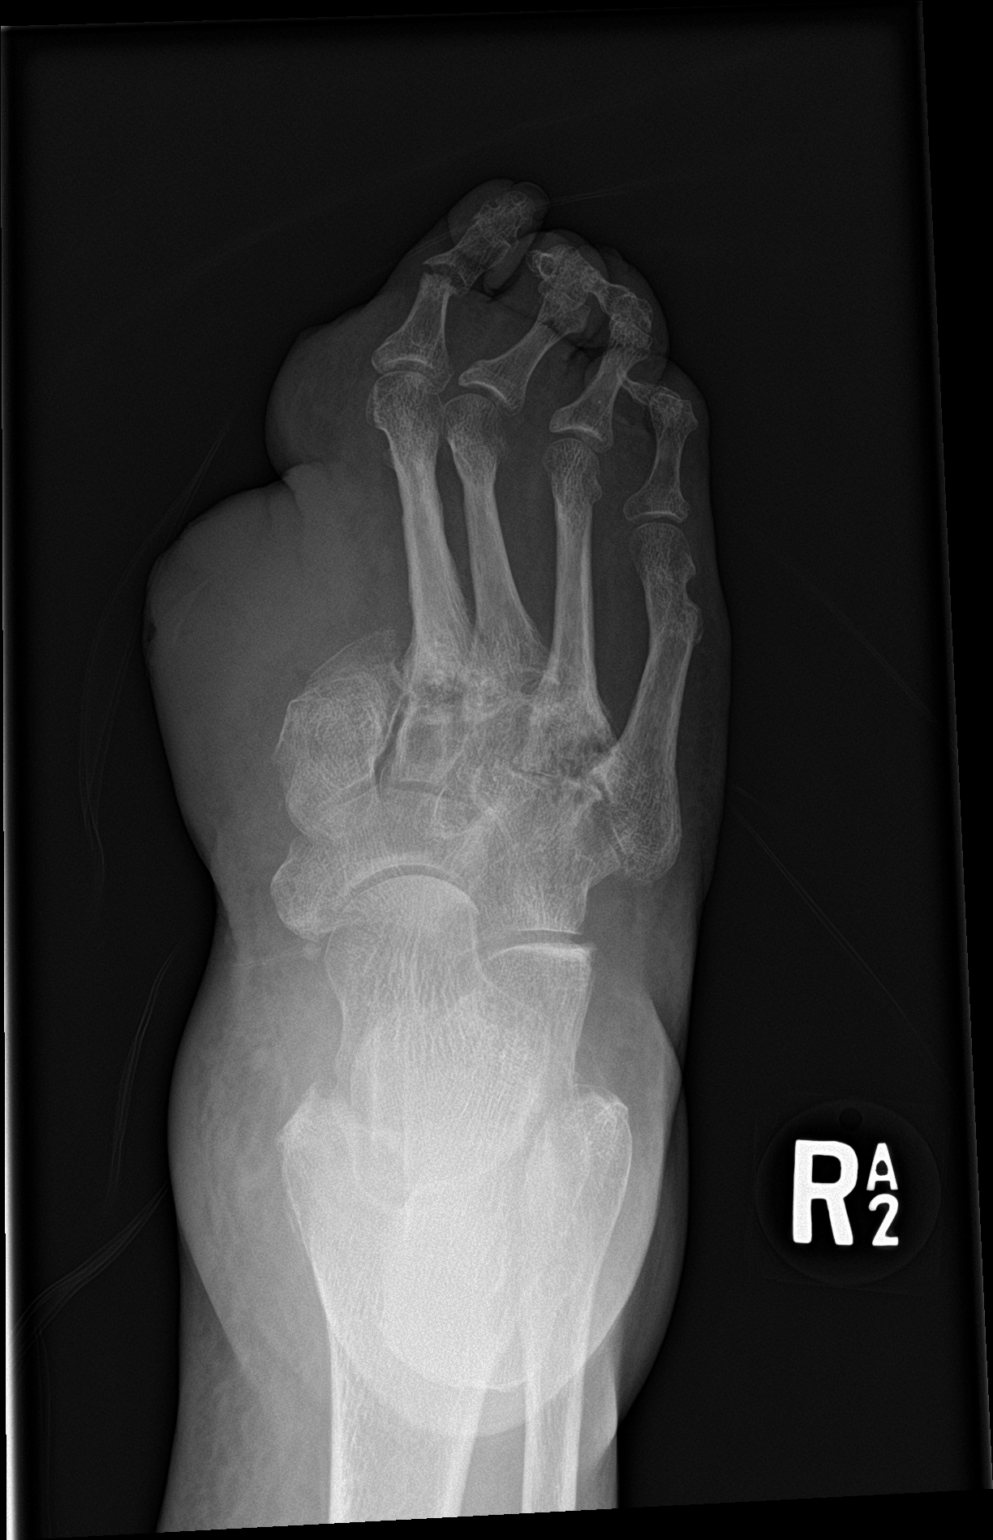

[foot obl]
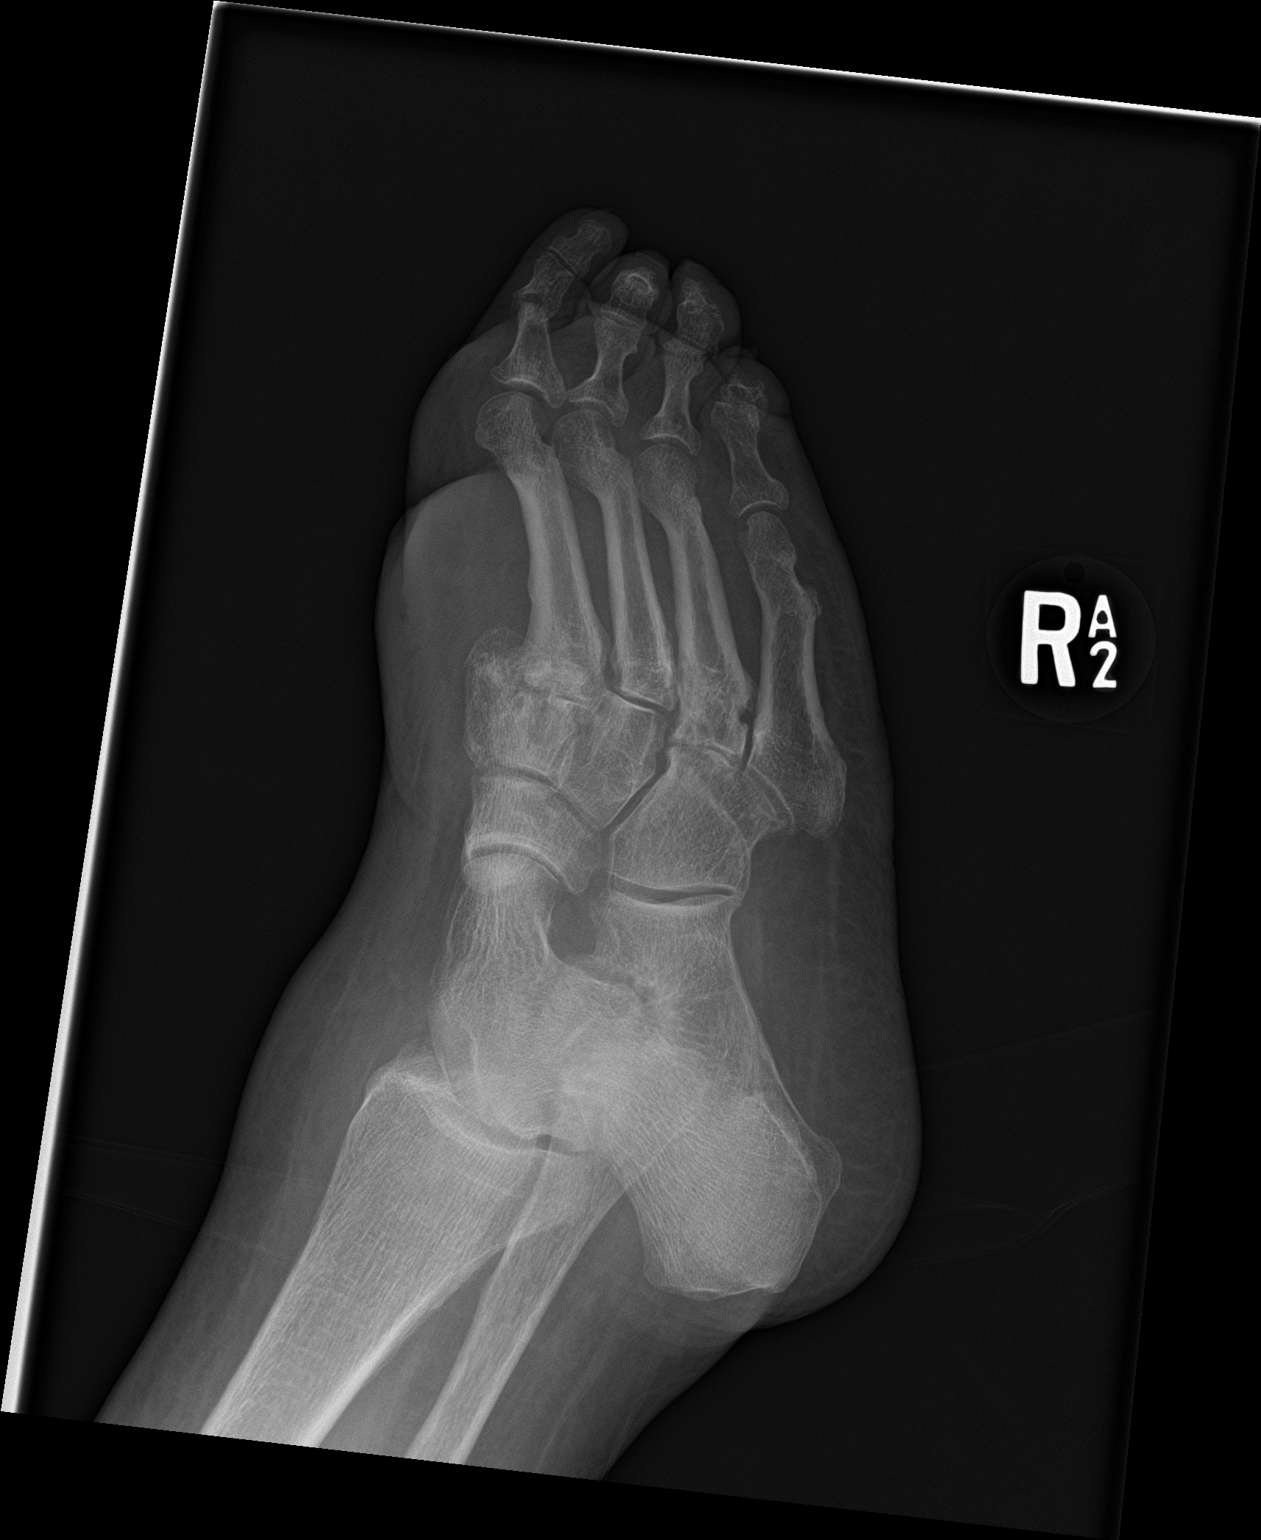

[foot lat]
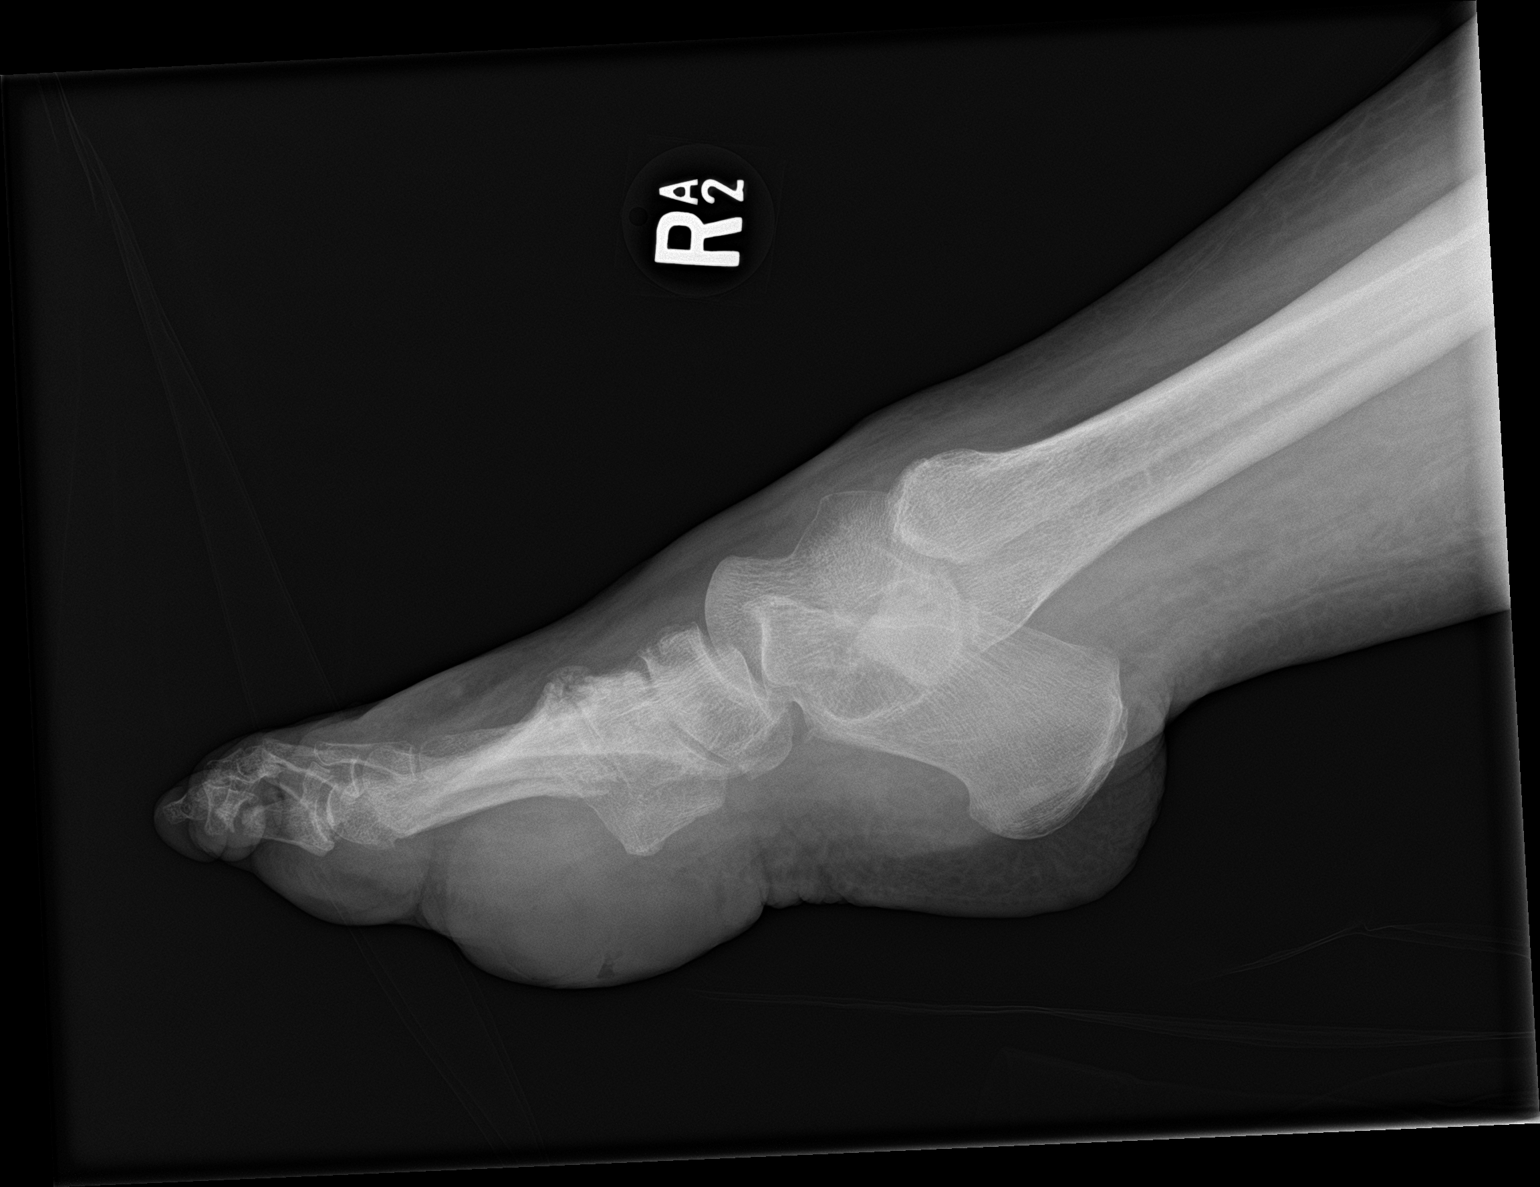

[3 of 3 positions shown; findings below may reference images not displayed]

FINDINGS: First ray amputation. Soft tissue swelling is seen along the medial
and plantar aspects of the foot with a lucent ulcer along the
plantar aspect of the dorsal forefoot. No underlying osseous erosion
to suggest osteomyelitis. Irregularity along the head of the second
proximal phalanx is seen. Old fifth metatarsal fracture.
Degenerative changes in the midfoot which may be Charcot in nature.
IMPRESSION: 1. Marked soft tissue swelling along the medial and plantar aspects
of the foot, with an associated superficial ulceration. No osseous
erosion to suggest osteomyelitis.
2. Irregularity of the second proximal phalangeal head may be
degenerative. Please correlate clinically.
3. Degenerative, possibly Charcot changes, in the midfoot.

## 2018-07-23 ENCOUNTER — Other Ambulatory Visit: Payer: Self-pay | Admitting: Psychiatry

## 2018-08-25 ENCOUNTER — Encounter: Payer: Self-pay | Admitting: Emergency Medicine

## 2018-08-25 DIAGNOSIS — F419 Anxiety disorder, unspecified: Secondary | ICD-10-CM | POA: Insufficient documentation

## 2018-08-25 DIAGNOSIS — F329 Major depressive disorder, single episode, unspecified: Secondary | ICD-10-CM | POA: Insufficient documentation

## 2018-08-25 DIAGNOSIS — F32A Depression, unspecified: Secondary | ICD-10-CM | POA: Insufficient documentation

## 2018-09-08 ENCOUNTER — Ambulatory Visit: Payer: Medicare Other | Admitting: Psychiatry

## 2018-09-08 DIAGNOSIS — F419 Anxiety disorder, unspecified: Secondary | ICD-10-CM

## 2018-09-08 DIAGNOSIS — F3289 Other specified depressive episodes: Secondary | ICD-10-CM | POA: Diagnosis not present

## 2018-09-08 MED ORDER — SERTRALINE HCL 100 MG PO TABS: ORAL_TABLET | ORAL | 1 refills | Status: DC

## 2018-09-08 MED ORDER — ALPRAZOLAM 1 MG PO TABS: ORAL_TABLET | ORAL | 1 refills | Status: DC

## 2018-09-08 MED ORDER — BUSPIRONE HCL 30 MG PO TABS: 30.0000 mg | ORAL_TABLET | Freq: Two times a day (BID) | ORAL | 1 refills | Status: DC

## 2018-09-08 NOTE — Progress Notes (Signed)
[facility]  Patient ID: Natalie Hampton,  MRN: [id number]  PCP: Esaw Grandchild, NP  Date of Evaluation: 09/08/2018 Time spent:20 minutes  Chief Complaint:   HISTORY/CURRENT STATUS: HPI patient last seen 06/09/2018 with a diagnosis of depression and anxiety.  She was doing better at that time.  Also has a past diagnosis of Xanax withdrawal but patient denies this. Currently anxiety worse due to upcoming dental work. Depression same.  Individual Medical History/ Review of Systems: Changes? :No   Allergies: Penicillins; Sulfa antibiotics; and Vancomycin  Current Medications:  Current Outpatient Medications:  .  ALPRAZolam (XANAX) 1 MG tablet, TAKE 1/2 TABLET BY MOUTH 3 TIMES A DAY, Disp: 45 tablet, Rfl: 1 .  buPROPion (WELLBUTRIN XL) 300 MG 24 hr tablet, Take 300 mg by mouth daily. , Disp: , Rfl:  .  busPIRone (BUSPAR) 30 MG tablet, Take 1 tablet (30 mg total) by mouth 2 (two) times daily., Disp: 180 tablet, Rfl: 1 .  sertraline (ZOLOFT) 50 MG tablet, Take 50 mg by mouth daily., Disp: , Rfl:  .  carisoprodol (SOMA) 350 MG tablet, Take 350 mg by mouth 3 (three) times daily., Disp: , Rfl:  .  diphenoxylate-atropine (LOMOTIL) 2.5-0.025 MG tablet, Take 1 tablet by mouth daily as needed for diarrhea or loose stools., Disp: , Rfl:  .  estradiol (ESTRACE) 1 MG tablet, Take 1 mg by mouth daily. , Disp: , Rfl:  .  gabapentin (NEURONTIN) 600 MG tablet, Take 600 mg by mouth 3 (three) times daily. , Disp: , Rfl:  .  HYDROcodone-acetaminophen (NORCO) 10-325 MG tablet, Take 1 tablet by mouth 3 (three) times daily., Disp: , Rfl:  .  levothyroxine (SYNTHROID, LEVOTHROID) 50 MCG tablet, Take 50 mcg by mouth daily. , Disp: , Rfl:  .  Multiple Vitamin (MULTIVITAMIN WITH MINERALS) TABS tablet, Take 1 tablet by mouth daily., Disp: , Rfl:  .  omeprazole (PRILOSEC) 40 MG capsule, Take 40 mg by mouth daily. , Disp: , Rfl:  .  Tetrahydrozoline HCl (VISINE OP), Place 1 drop into both eyes daily as needed  (dry eyes)., Disp: , Rfl:  Medication Side Effects: none  Family Medical/ Social History: Changes? no  MENTAL HEALTH EXAM:  There were no vitals taken for this visit.There is no height or weight on file to calculate BMI.  General Appearance: Casual in wheelchair  Eye Contact:  Good  Speech:  Normal Rate  Volume:  Normal  Mood:  Euthymic  Affect:  Appropriate  Thought Process:  Linear  Orientation:  Full (Time, Place, and Person)  Thought Content: Logical   Suicidal Thoughts:  No  Homicidal Thoughts:  No  Memory:  WNL  Judgement:  Good  Insight:  Good  Psychomotor Activity:  Normal  Concentration:  Concentration: Good  Recall:  Good  Fund of Knowledge: Good  Language: Good  Assets:  Social Support  ADL's:  Intact  Cognition: WNL  Prognosis:  Good    DIAGNOSES:    ICD-10-CM   1. Other depression F32.89   2. Anxiety F41.9     Receiving Psychotherapy: No    RECOMMENDATIONS:  Will increase zoloft to 200mg /day. Continue buspar 30mg  bid. Welbutrin xl at 300/day. Continue xanax. We only write buspar and xanax. PCP writes rest(zoloft ?) and wellbutrin lx   [employer], PA-C

## 2018-10-16 ENCOUNTER — Other Ambulatory Visit: Payer: Self-pay | Admitting: Psychiatry

## 2018-11-03 ENCOUNTER — Ambulatory Visit: Payer: Medicare Other | Admitting: Psychiatry

## 2018-11-03 DIAGNOSIS — F4321 Adjustment disorder with depressed mood: Secondary | ICD-10-CM

## 2018-11-03 DIAGNOSIS — F3289 Other specified depressive episodes: Secondary | ICD-10-CM

## 2018-11-03 DIAGNOSIS — F419 Anxiety disorder, unspecified: Secondary | ICD-10-CM | POA: Diagnosis not present

## 2018-11-03 NOTE — Progress Notes (Signed)
[facility]  Patient ID: Natalie Hampton,  MRN: [id number]  PCP: Esaw Grandchild, NP  Date of Evaluation: 11/03/2018 Time spent:30 minutes  Chief Complaint:   HISTORY/CURRENT STATUS: HPI Natalie Hampton was last seen September 08, 2018 she had increased anxiety we increased her Zoloft.  Since then she has had 2 deaths in her family her brother died 09-22-2018 and her husband died 10-15-2018.  She is really struggling with anxiety and grief right now she is overall, shaking, cries, sleep is variable, eating is decreased, decreased appetite decreased energy.  No suicidal thoughts present.  She does have some passive thoughts wishing she could be with her husband in heaven.  She has a lot to do she is worrying a lot she does have good friends to help her.  She is having panic attacks with shortness of breath sweaty lasting 20 to 30 minutes.  No chest pain and heart racing. Individual Medical History/ Review of Systems: Changes? :No   Allergies: Penicillins; Sulfa antibiotics; and Vancomycin  Current Medications:  Current Outpatient Medications:  .  ALPRAZolam (XANAX) 1 MG tablet, TAKE 1/2 TABLET BY MOUTH 3 TIMES A DAY, Disp: 45 tablet, Rfl: 1 .  buPROPion (WELLBUTRIN XL) 300 MG 24 hr tablet, Take 300 mg by mouth daily. , Disp: , Rfl:  .  busPIRone (BUSPAR) 30 MG tablet, TAKE 1 TABLET BY MOUTH TWO  TIMES DAILY, Disp: 180 tablet, Rfl: 1 .  carisoprodol (SOMA) 350 MG tablet, Take 350 mg by mouth 3 (three) times daily., Disp: , Rfl:  .  diphenoxylate-atropine (LOMOTIL) 2.5-0.025 MG tablet, Take 1 tablet by mouth daily as needed for diarrhea or loose stools., Disp: , Rfl:  .  estradiol (ESTRACE) 1 MG tablet, Take 1 mg by mouth daily. , Disp: , Rfl:  .  gabapentin (NEURONTIN) 600 MG tablet, Take 600 mg by mouth 3 (three) times daily. , Disp: , Rfl:  .  HYDROcodone-acetaminophen (NORCO) 10-325 MG tablet, Take 1 tablet by mouth 3 (three) times daily., Disp: , Rfl:  .  levothyroxine (SYNTHROID,  LEVOTHROID) 50 MCG tablet, Take 50 mcg by mouth daily. , Disp: , Rfl:  .  Multiple Vitamin (MULTIVITAMIN WITH MINERALS) TABS tablet, Take 1 tablet by mouth daily., Disp: , Rfl:  .  omeprazole (PRILOSEC) 40 MG capsule, Take 40 mg by mouth daily. , Disp: , Rfl:  .  sertraline (ZOLOFT) 100 MG tablet, 2  A day, Disp: 180 tablet, Rfl: 1 .  Tetrahydrozoline HCl (VISINE OP), Place 1 drop into both eyes daily as needed (dry eyes)., Disp: , Rfl:  Medication Side Effects: none  Family Medical/ Social History: Changes? no  MENTAL HEALTH EXAM:  There were no vitals taken for this visit.There is no height or weight on file to calculate BMI.  General Appearance: Casual in wheelchair. sad  Eye Contact:  Good  Speech:  normal  Volume:  Normal  Mood:  Depressed  Affect:  Appropriate  Thought Process:  Linear  Orientation:  Full (Time, Place, and Person)  Thought Content: Logical   Suicidal Thoughts:  No  Homicidal Thoughts:  No  Memory:  WNL  Judgement:  Good  Insight:  Good  Psychomotor Activity:  Normal  Concentration:  fair  Recall:  Good  Fund of Knowledge: Good  Language: Good  Assets:  Desire for Improvement  ADL's:  Intact  Cognition: WNL  Prognosis:  Good    DIAGNOSES: No diagnosis found.  Receiving Psychotherapy: No  Natalie Hampton has friends and  family members that are helping her.   RECOMMENDATIONS: She is to increase her gabapentin to 600 mg 4 times daily.  Continue Zoloft 200 continue BuSpar 30 twice daily, continue Wellbutrin XL 300, continue Xanax 0.5 3 times daily.  Patient commits to safety we will try to do a phone interview next visit as it is hard to get out like to see her in 2 weeks.   Comer Locket, PA-C

## 2018-11-14 ENCOUNTER — Telehealth: Payer: Self-pay | Admitting: Psychiatry

## 2018-11-14 NOTE — Telephone Encounter (Signed)
Pt called. She missed your call. On the phone with SS office, could not click over to get your call. Please return call. [phone]

## 2018-11-18 ENCOUNTER — Ambulatory Visit: Payer: Medicare Other | Admitting: Psychiatry

## 2018-11-27 ENCOUNTER — Ambulatory Visit: Payer: Medicare Other | Admitting: Psychiatry

## 2018-12-22 ENCOUNTER — Ambulatory Visit: Payer: Medicare Other | Admitting: Psychiatry

## 2019-01-06 ENCOUNTER — Other Ambulatory Visit: Payer: Self-pay

## 2019-01-06 MED ORDER — ALPRAZOLAM 1 MG PO TABS: ORAL_TABLET | ORAL | 1 refills | Status: DC

## 2019-01-11 ENCOUNTER — Other Ambulatory Visit: Payer: Self-pay

## 2019-01-11 MED ORDER — SERTRALINE HCL 100 MG PO TABS: ORAL_TABLET | ORAL | 0 refills | Status: DC

## 2019-01-26 ENCOUNTER — Ambulatory Visit: Payer: Medicare Other | Admitting: Psychiatry

## 2019-01-30 ENCOUNTER — Other Ambulatory Visit: Payer: Self-pay

## 2019-01-30 ENCOUNTER — Ambulatory Visit: Payer: Medicare Other | Admitting: Psychiatry

## 2019-01-30 NOTE — Progress Notes (Signed)
Attempted to reach patient for scheduled tele-visit x 3. No answer. Left message recommending that she contact the office to re-schedule.

## 2019-02-04 ENCOUNTER — Encounter: Payer: Self-pay | Admitting: Psychiatry

## 2019-02-04 ENCOUNTER — Ambulatory Visit (INDEPENDENT_AMBULATORY_CARE_PROVIDER_SITE_OTHER): Payer: Medicare Other | Admitting: Psychiatry

## 2019-02-04 ENCOUNTER — Other Ambulatory Visit: Payer: Self-pay

## 2019-02-04 DIAGNOSIS — F3289 Other specified depressive episodes: Secondary | ICD-10-CM

## 2019-02-04 DIAGNOSIS — F419 Anxiety disorder, unspecified: Secondary | ICD-10-CM

## 2019-02-04 DIAGNOSIS — F4321 Adjustment disorder with depressed mood: Secondary | ICD-10-CM

## 2019-02-04 MED ORDER — SERTRALINE HCL 100 MG PO TABS: ORAL_TABLET | ORAL | 0 refills | Status: DC

## 2019-02-04 MED ORDER — GABAPENTIN 600 MG PO TABS: 600.0000 mg | ORAL_TABLET | Freq: Four times a day (QID) | ORAL | 1 refills | Status: DC

## 2019-02-04 MED ORDER — BUSPIRONE HCL 30 MG PO TABS: 30.0000 mg | ORAL_TABLET | Freq: Two times a day (BID) | ORAL | 1 refills | Status: DC

## 2019-02-04 MED ORDER — ALPRAZOLAM 1 MG PO TABS: ORAL_TABLET | ORAL | 2 refills | Status: DC

## 2019-02-04 NOTE — Progress Notes (Signed)
[name] [id number] April 06, 1951 68 y.o.  Virtual Visit via Telephone Note  I connected with@ on 02/04/19 at 10:00 AM EDT by telephone and verified that I am speaking with the correct person using two identifiers.   I discussed the limitations, risks, security and privacy concerns of performing an evaluation and management service by telephone and the availability of in person appointments. I also discussed with the patient that there may be a patient responsible charge related to this service. The patient expressed understanding and agreed to proceed.   I discussed the assessment and treatment plan with the patient. The patient was provided an opportunity to ask questions and all were answered. The patient agreed with the plan and demonstrated an understanding of the instructions.   The patient was advised to call back or seek an in-person evaluation if the symptoms worsen or if the condition fails to improve as anticipated.  I provided 30 minutes of non-face-to-face time during this encounter.  The patient was located at home.  The provider was located at home.   Thayer Headings, PMHNP   Subjective:   Patient ID:  Natalie Hampton is a 68 y.o. (DOB 04/20/1951) female.  Chief Complaint:  Chief Complaint  Patient presents with  . Anxiety  . Depression    HPI Natalie Hampton presents for follow-up of anxiety. Previous pt of [place], [place]. She reports "my anxiety is out the roof." Reports long-standing h/o anxiety and panic attacks and recalls severe anxiety in childhood. She reports frequent panic attacks. She reports frequent worry and worry about finances. She reports that she has exaggerated startle response. She reports depression "is not as bad as anxiety." She reports "myShe reports that her sleep "will go in cycles" with periods of adequate sleep and then periods of sleeplessness for 2-3 days. She reports that her appetite is decreased and has to make herself eat. Estimates losing over 20 lbs  since January. She reports low energy related to fibromyalgia and chronic fatigue. She reports that her motivation is adequate. She reports occ mild difficulty with concentration. Denies SI.   Husband passed away Oct 07, 2018. Her brother died 3 weeks before her husband. Reports that her husband would previously assist her with getting to apts.   Gabapentin- Reports that this has been helpful for her neuropathy. Sertraline Buspar  Wellbutrin XL  Review of Systems:  Review of Systems  HENT: Positive for dental problem.   Gastrointestinal: Positive for diarrhea.  Genitourinary:       Incontinence secondary to bladder cancer  Musculoskeletal: Positive for gait problem.       Reports that she is w/c bound.  Neurological: Negative for tremors.  Psychiatric/Behavioral:       Please refer to HPI    Medications: I have reviewed the patient's current medications.  Current Outpatient Medications  Medication Sig Dispense Refill  . [START ON 03/03/2019] ALPRAZolam (XANAX) 1 MG tablet TAKE 1/2 TABLET BY MOUTH 3 TIMES A DAY 45 tablet 2  . buPROPion (WELLBUTRIN XL) 300 MG 24 hr tablet Take 300 mg by mouth daily.     . busPIRone (BUSPAR) 30 MG tablet Take 1 tablet (30 mg total) by mouth 2 (two) times daily. 180 tablet 1  . gabapentin (NEURONTIN) 600 MG tablet Take 1 tablet (600 mg total) by mouth 4 (four) times daily. 360 tablet 1  . HYDROcodone-acetaminophen (NORCO) 10-325 MG tablet Take 1 tablet by mouth every 6 (six) hours as needed.     Marland Kitchen levothyroxine (SYNTHROID, LEVOTHROID) 50  MCG tablet Take 50 mcg by mouth daily.     . Multiple Vitamin (MULTIVITAMIN WITH MINERALS) TABS tablet Take 1 tablet by mouth daily.    Marland Kitchen omeprazole (PRILOSEC) 40 MG capsule Take 40 mg by mouth daily.     . sertraline (ZOLOFT) 100 MG tablet 2  A day 180 tablet 0  . Tetrahydrozoline HCl (VISINE OP) Place 1 drop into both eyes daily as needed (dry eyes).    Marland Kitchen tiZANidine (ZANAFLEX) 2 MG tablet      No current  facility-administered medications for this visit.     Medication Side Effects: None  Allergies:  Allergies  Allergen Reactions  . Penicillins Anaphylaxis  . Sulfa Antibiotics Hives  . Vancomycin Diarrhea    Past Medical History:  Diagnosis Date  . Anxiety   . Arthritis   . Bladder cancer ([facility]) 20 years ago  . Chronic fatigue   . Depression   . Fibromyalgia   . GERD (gastroesophageal reflux disease)   . History of hiatal hernia   . Hypothyroidism   . IBS (irritable bowel syndrome)   . PONV (postoperative nausea and vomiting)   . Spina bifida ([facility])   . Urinary incontinence    From bladder cancer and radiation    Family History  Problem Relation Age of Onset  . Alzheimer's disease Father     Social History   Socioeconomic History  . Marital status: Married    Spouse name: Not on file  . Number of children: Not on file  . Years of education: Not on file  . Highest education level: Not on file  Occupational History  . Not on file  Social Needs  . Financial resource strain: Not on file  . Food insecurity:    Worry: Not on file    Inability: Not on file  . Transportation needs:    Medical: Not on file    Non-medical: Not on file  Tobacco Use  . Smoking status: Never Smoker  . Smokeless tobacco: Never Used  Substance and Sexual Activity  . Alcohol use: Yes    Comment: rare  . Drug use: No  . Sexual activity: Never  Lifestyle  . Physical activity:    Days per week: Not on file    Minutes per session: Not on file  . Stress: Not on file  Relationships  . Social connections:    Talks on phone: Not on file    Gets together: Not on file    Attends religious service: Not on file    Active member of club or organization: Not on file    Attends meetings of clubs or organizations: Not on file    Relationship status: Not on file  . Intimate partner violence:    Fear of current or ex partner: Not on file    Emotionally abused: Not on file    Physically  abused: Not on file    Forced sexual activity: Not on file  Other Topics Concern  . Not on file  Social History Narrative  . Not on file    Past Medical History, Surgical history, Social history, and Family history were reviewed and updated as appropriate.   Please see review of systems for further details on the patient's review from today.   Objective:   Physical Exam:  There were no vitals taken for this visit.  Physical Exam Neurological:     Mental Status: She is alert and oriented to person, place, and time.  Cranial Nerves: No dysarthria.  Psychiatric:        Attention and Perception: Attention normal.        Mood and Affect: Mood is anxious and depressed.        Speech: Speech normal.        Behavior: Behavior is cooperative.        Thought Content: Thought content normal. Thought content is not paranoid or delusional. Thought content does not include homicidal or suicidal ideation. Thought content does not include homicidal or suicidal plan.        Cognition and Memory: Cognition and memory normal.        Judgment: Judgment normal.     Lab Review:     Component Value Date/Time   NA 136 12/27/2016 0436   K 3.6 12/27/2016 0436   CL 100 (L) 12/27/2016 0436   CO2 24 12/27/2016 0436   GLUCOSE 92 12/27/2016 0436   BUN <5 (L) 12/27/2016 0436   CREATININE 0.56 12/27/2016 0436   CALCIUM 8.4 (L) 12/27/2016 0436   PROT 6.7 12/26/2016 1205   ALBUMIN 3.0 (L) 12/26/2016 1205   AST 15 12/26/2016 1205   ALT 15 12/26/2016 1205   ALKPHOS 98 12/26/2016 1205   BILITOT 0.1 (L) 12/26/2016 1205   GFRNONAA >60 12/27/2016 0436   GFRAA >60 12/27/2016 0436       Component Value Date/Time   WBC 12.1 (H) 12/28/2016 0503   RBC 2.96 (L) 12/28/2016 0503   HGB 9.1 (L) 12/28/2016 0503   HCT 28.7 (L) 12/28/2016 0503   PLT 404 (H) 12/28/2016 0503   MCV 97.0 12/28/2016 0503   MCH 30.7 12/28/2016 0503   MCHC 31.7 12/28/2016 0503   RDW 15.1 12/28/2016 0503   LYMPHSABS 1.5  03/25/2014 1606   MONOABS 0.7 03/25/2014 1606   EOSABS 0.2 03/25/2014 1606   BASOSABS 0.0 03/25/2014 1606    No results found for: POCLITH, LITHIUM   No results found for: PHENYTOIN, PHENOBARB, VALPROATE, CBMZ   .res Assessment: Plan:   Increase Gabapentin to 600 mg four times daily to improve anxiety.  Continue Sertraline 200 mg po qd for depression and anxiety.  Continue Buspar 30 mg po BID for anxiety.  Continue Xanax 1 mg 1/2  Tab po TID. Recommend continuing Wellbutrin XL 300 mg po qd per PCP.  Pt to f/u in 2 months or sooner if clinically indicated.  Patient advised to contact office with any questions, adverse effects, or acute worsening in signs and symptoms.   Anxiety - Plan: ALPRAZolam (XANAX) 1 MG tablet, busPIRone (BUSPAR) 30 MG tablet, sertraline (ZOLOFT) 100 MG tablet, gabapentin (NEURONTIN) 600 MG tablet  Grief  Other depression - Plan: sertraline (ZOLOFT) 100 MG tablet  Please see After Visit Summary for patient specific instructions.  No future appointments.  No orders of the defined types were placed in this encounter.     -------------------------------

## 2019-03-31 ENCOUNTER — Other Ambulatory Visit: Payer: Self-pay | Admitting: Psychiatry

## 2019-03-31 DIAGNOSIS — F3289 Other specified depressive episodes: Secondary | ICD-10-CM

## 2019-03-31 DIAGNOSIS — F419 Anxiety disorder, unspecified: Secondary | ICD-10-CM

## 2019-04-06 ENCOUNTER — Encounter: Payer: Self-pay | Admitting: Psychiatry

## 2019-04-06 ENCOUNTER — Other Ambulatory Visit: Payer: Self-pay

## 2019-04-06 ENCOUNTER — Ambulatory Visit (INDEPENDENT_AMBULATORY_CARE_PROVIDER_SITE_OTHER): Payer: Medicare Other | Admitting: Psychiatry

## 2019-04-06 DIAGNOSIS — F3289 Other specified depressive episodes: Secondary | ICD-10-CM

## 2019-04-06 DIAGNOSIS — F419 Anxiety disorder, unspecified: Secondary | ICD-10-CM

## 2019-04-06 MED ORDER — GABAPENTIN 600 MG PO TABS: 600.0000 mg | ORAL_TABLET | Freq: Every day | ORAL | 1 refills | Status: DC

## 2019-04-06 MED ORDER — SERTRALINE HCL 100 MG PO TABS: ORAL_TABLET | ORAL | 1 refills | Status: DC

## 2019-04-06 NOTE — Progress Notes (Signed)
Natalie Hampton [id number] 04-05-1951 68 y.o.  Virtual Visit via Telephone Note  I connected with pt on 04/06/19 at 10:30 AM EDT by telephone and verified that I am speaking with the correct person using two identifiers.   I discussed the limitations, risks, security and privacy concerns of performing an evaluation and management service by telephone and the availability of in person appointments. I also discussed with the patient that there may be a patient responsible charge related to this service. The patient expressed understanding and agreed to proceed.   I discussed the assessment and treatment plan with the patient. The patient was provided an opportunity to ask questions and all were answered. The patient agreed with the plan and demonstrated an understanding of the instructions.   The patient was advised to call back or seek an in-person evaluation if the symptoms worsen or if the condition fails to improve as anticipated.  I provided 30 minutes of non-face-to-face time during this encounter.  The patient was located at home.  The provider was located at [place].   Natalie Hampton, PMHNP   Subjective:   Patient ID:  Natalie Hampton is a 68 y.o. (DOB 27-Sep-1950) female.  Chief Complaint:  Chief Complaint  Patient presents with  . Anxiety  . Insomnia    HPI Natalie Hampton presents for follow-up of anxiety and depression. She reports, "I'm ok considering the circumstances." She reports that she has been experiencing frequent anxiety in response to deaths or husband and brother and the pandemic. Reports that she will feel "my entire body is wired up" and shaking on the inside. She reports that she has had stress with handling her husband's estate. Has apt with pain management specialist tomorrow and has severe anxiety about this and her risk of possibly getting COVID. She reports panic attacks daily. She is unsure if increase in Gabapentin was effective for her anxiety. She reports that  she has noticed some decrease in pain in her legs. She reports that she continues to grieve multiple losses. Reports that she has been experiencing anger in response to grief and then feels guilty. Denies depressed mood.   She reports difficulty falling asleep. Reports there have been a few nights where she has been up most of the night. Averaging about 6-7 hours a night. Occ taking a nap. She reports that her appetite has been decreased and is experiencing early satiety and feeling as if food is difficult to swallow. Reports that she has lost weight and is not sure how much weight she has lost. Reports that energy is chronically low due to fibromyalgia. Reports motivation has been low and she has not been doing some things at home that she normally will do. She reports difficulty with concentration and will have to read things several times to comprehend the material and will "get off track." She reports that she enjoys her dog, watching movies, and reading. Denies SI.   Past Psychiatric Medication Trials: Gabapentin- Reports that this has been helpful for her neuropathy. Sertraline Paxil Effexor Cymbalta Prozac Buspar  Wellbutrin XL Xanax Trazodone.   Review of Systems:  Review of Systems  Gastrointestinal: Positive for diarrhea.  Genitourinary:       Partial urinary incontinence after bladder cancer.  Musculoskeletal: Positive for arthralgias and myalgias.    Medications: I have reviewed the patient's current medications.  Current Outpatient Medications  Medication Sig Dispense Refill  . ALPRAZolam (XANAX) 1 MG tablet TAKE 1/2 TABLET BY MOUTH 3 TIMES A DAY 45  tablet 2  . buPROPion (WELLBUTRIN XL) 300 MG 24 hr tablet Take 300 mg by mouth daily.     . busPIRone (BUSPAR) 30 MG tablet Take 1 tablet (30 mg total) by mouth 2 (two) times daily. 180 tablet 1  . gabapentin (NEURONTIN) 600 MG tablet Take 1 tablet (600 mg total) by mouth 5 (five) times daily. 360 tablet 1  .  HYDROcodone-acetaminophen (NORCO) 10-325 MG tablet Take 1 tablet by mouth every 6 (six) hours as needed.     Marland Kitchen levothyroxine (SYNTHROID, LEVOTHROID) 50 MCG tablet Take 50 mcg by mouth daily.     . Loperamide HCl (IMODIUM PO) Take by mouth.    . Multiple Vitamin (MULTIVITAMIN WITH MINERALS) TABS tablet Take 1 tablet by mouth daily.    Marland Kitchen omeprazole (PRILOSEC) 40 MG capsule Take 40 mg by mouth daily.     . sertraline (ZOLOFT) 100 MG tablet TAKE 2 TABLETS BY MOUTH  DAILY 180 tablet 1  . Tetrahydrozoline HCl (VISINE OP) Place 1 drop into both eyes daily as needed (dry eyes).    Marland Kitchen tiZANidine (ZANAFLEX) 2 MG tablet      No current facility-administered medications for this visit.     Medication Side Effects: None  Allergies:  Allergies  Allergen Reactions  . Penicillins Anaphylaxis  . Sulfa Antibiotics Hives  . Vancomycin Diarrhea    Past Medical History:  Diagnosis Date  . Anxiety   . Arthritis   . Bladder cancer ([place]) 20 years ago  . Chronic fatigue   . Depression   . Fibromyalgia   . GERD (gastroesophageal reflux disease)   . History of hiatal hernia   . Hypothyroidism   . IBS (irritable bowel syndrome)   . PONV (postoperative nausea and vomiting)   . Spina bifida ([place])   . Urinary incontinence    From bladder cancer and radiation    Family History  Problem Relation Age of Onset  . Alzheimer's disease Father     Social History   Socioeconomic History  . Marital status: Married    Spouse name: Not on file  . Number of children: Not on file  . Years of education: Not on file  . Highest education level: Not on file  Occupational History  . Not on file  Social Needs  . Financial resource strain: Not on file  . Food insecurity    Worry: Not on file    Inability: Not on file  . Transportation needs    Medical: Not on file    Non-medical: Not on file  Tobacco Use  . Smoking status: Never Smoker  . Smokeless tobacco: Never Used  Substance and Sexual Activity  .  Alcohol use: Yes    Comment: rare  . Drug use: No  . Sexual activity: Never  Lifestyle  . Physical activity    Days per week: Not on file    Minutes per session: Not on file  . Stress: Not on file  Relationships  . Social Herbalist on phone: Not on file    Gets together: Not on file    Attends religious service: Not on file    Active member of club or organization: Not on file    Attends meetings of clubs or organizations: Not on file    Relationship status: Not on file  . Intimate partner violence    Fear of current or ex partner: Not on file    Emotionally abused: Not on file  Physically abused: Not on file    Forced sexual activity: Not on file  Other Topics Concern  . Not on file  Social History Narrative  . Not on file    Past Medical History, Surgical history, Social history, and Family history were reviewed and updated as appropriate.   Please see review of systems for further details on the patient's review from today.   Objective:   Physical Exam:  There were no vitals taken for this visit.  Physical Exam Neurological:     Mental Status: She is alert and oriented to person, place, and time.     Cranial Nerves: No dysarthria.  Psychiatric:        Attention and Perception: Attention normal.        Mood and Affect: Mood is anxious and depressed.        Speech: Speech normal.        Behavior: Behavior is cooperative.        Thought Content: Thought content normal. Thought content is not paranoid or delusional. Thought content does not include homicidal or suicidal ideation. Thought content does not include homicidal or suicidal plan.        Cognition and Memory: Cognition and memory normal.        Judgment: Judgment normal.     Lab Review:     Component Value Date/Time   NA 136 12/27/2016 0436   K 3.6 12/27/2016 0436   CL 100 (L) 12/27/2016 0436   CO2 24 12/27/2016 0436   GLUCOSE 92 12/27/2016 0436   BUN <5 (L) 12/27/2016 0436    CREATININE 0.56 12/27/2016 0436   CALCIUM 8.4 (L) 12/27/2016 0436   PROT 6.7 12/26/2016 1205   ALBUMIN 3.0 (L) 12/26/2016 1205   AST 15 12/26/2016 1205   ALT 15 12/26/2016 1205   ALKPHOS 98 12/26/2016 1205   BILITOT 0.1 (L) 12/26/2016 1205   GFRNONAA >60 12/27/2016 0436   GFRAA >60 12/27/2016 0436       Component Value Date/Time   WBC 12.1 (H) 12/28/2016 0503   RBC 2.96 (L) 12/28/2016 0503   HGB 9.1 (L) 12/28/2016 0503   HCT 28.7 (L) 12/28/2016 0503   PLT 404 (H) 12/28/2016 0503   MCV 97.0 12/28/2016 0503   MCH 30.7 12/28/2016 0503   MCHC 31.7 12/28/2016 0503   RDW 15.1 12/28/2016 0503   LYMPHSABS 1.5 03/25/2014 1606   MONOABS 0.7 03/25/2014 1606   EOSABS 0.2 03/25/2014 1606   BASOSABS 0.0 03/25/2014 1606    No results found for: POCLITH, LITHIUM   No results found for: PHENYTOIN, PHENOBARB, VALPROATE, CBMZ   .res Assessment: Plan:   Discussed possible tx options for anxiety to include increasing Gabapentin. Pt agrees to increase in Gabapentin for anxiety. Pt reports that Gabapentin has also been partially effective for chronic pain. Pt requests increase in Xanax. Informed pt that case would need to be staffed with Dr. Clovis Pu due to potential risks of CNS depression with concomitant use of hydrocodone-acetaminophen 5-325 four times daily.  Will continue Buspar 30 mg po BID for anxiety. Continue Wellbutrin XL 300 mg po qd for depression.  Pt to f/u in 2 months or sooner if clinically indicated. Patient advised to contact office with any questions, adverse effects, or acute worsening in signs and symptoms.  Jaine was seen today for anxiety and insomnia.  Diagnoses and all orders for this visit:  Anxiety -     gabapentin (NEURONTIN) 600 MG tablet; Take 1 tablet (600 mg  total) by mouth 5 (five) times daily. -     sertraline (ZOLOFT) 100 MG tablet; TAKE 2 TABLETS BY MOUTH  DAILY  Other depression -     sertraline (ZOLOFT) 100 MG tablet; TAKE 2 TABLETS BY MOUTH  DAILY     Please see After Visit Summary for patient specific instructions.  Future Appointments  Date Time Provider Shepherd  06/08/2019 10:30 AM Natalie Hampton, PMHNP CP-[facility] None    No orders of the defined types were placed in this encounter.     -------------------------------

## 2019-05-26 ENCOUNTER — Other Ambulatory Visit: Payer: Self-pay | Admitting: Psychiatry

## 2019-05-26 DIAGNOSIS — F419 Anxiety disorder, unspecified: Secondary | ICD-10-CM

## 2019-05-26 NOTE — Telephone Encounter (Signed)
Has appt 09/28

## 2019-05-27 ENCOUNTER — Other Ambulatory Visit: Payer: Self-pay

## 2019-05-27 NOTE — Telephone Encounter (Signed)
Prior authorization submitted and approved for alprazolam 1mg  #45 for 30 day supply [id number].approved through 09/10/2019.

## 2019-06-08 ENCOUNTER — Ambulatory Visit (INDEPENDENT_AMBULATORY_CARE_PROVIDER_SITE_OTHER): Payer: Medicare Other | Admitting: Psychiatry

## 2019-06-08 ENCOUNTER — Other Ambulatory Visit: Payer: Self-pay

## 2019-06-08 ENCOUNTER — Encounter: Payer: Self-pay | Admitting: Psychiatry

## 2019-06-08 ENCOUNTER — Ambulatory Visit: Payer: Medicare Other | Admitting: Psychiatry

## 2019-06-08 DIAGNOSIS — F419 Anxiety disorder, unspecified: Secondary | ICD-10-CM

## 2019-06-08 MED ORDER — ALPRAZOLAM 1 MG PO TABS: ORAL_TABLET | ORAL | 2 refills | Status: DC

## 2019-06-08 NOTE — Progress Notes (Signed)
Natalie Hampton:[id number] 03/16/1951 68 y.o.  Virtual Visit via Telephone Note  I connected with pt on 06/08/19 at  3:45 PM EDT by telephone and verified that I am speaking with the correct person using two identifiers.   I discussed the limitations, risks, security and privacy concerns of performing an evaluation and management service by telephone and the availability of in person appointments. I also discussed with the patient that there may be a patient responsible charge related to this service. The patient expressed understanding and agreed to proceed.   I discussed the assessment and treatment plan with the patient. The patient was provided an opportunity to ask questions and all were answered. The patient agreed with the plan and demonstrated an understanding of the instructions.   The patient was advised to call back or seek an in-person evaluation if the symptoms worsen or if the condition fails to improve as anticipated.  I provided 25 minutes of non-face-to-face time during this encounter.  The patient was located at home.  The provider was located at [place].   Thayer Headings, PMHNP   Subjective:   Patient ID:  Natalie Hampton is a 68 y.o. (DOB [birth year]) female.  Chief Complaint:  Chief Complaint  Patient presents with  . Anxiety  . Depression    HPI Natalie Hampton presents for follow-up of anxiety and depression. She reports that she has had difficulty on days that are anniversary dates of lost loved ones birthdays, anniversaries, and deaths. Her and her husband's wedding anniversary would have been Oct 16th and she anticipates this being very difficult. She reports that her anxiety remains high and has "knots in my stomach," decreased appetite, and worsening IBS s/s. She reports having at least 1-2 panic attacks daily. Frequent worry. She reports occ thinking about the past or "what might have been." She reports that she frequently has difficulty with sleep. Often has  difficulty falling asleep and then will awaken after sleeping only 3-4 hours. She reports that she then has difficulty returning to sleep. She reports that she has had a few nightmares about her deceased husband and has thought he is angry at her and standing over her. Estimates sleeping 5-6 hours fragmented over the course of 24-hours. She reports that she has had some weight loss. Trying to drink a protein shake daily. She reports low energy and low motivation. She reports that concentration is poor and she is getting lost in thought at times. Denies SI.   Past Psychiatric Medication Trials: Gabapentin- Reports that this has been helpful for her neuropathy. Sertraline Paxil Effexor Cymbalta Prozac Buspar  Wellbutrin XL Xanax Trazodone.   Review of Systems:  Review of Systems  Gastrointestinal: Positive for diarrhea.  Genitourinary:       Chronic urinary s/s to include incontinence  Musculoskeletal: Negative for gait problem.  Neurological: Positive for tremors.       Reports tremors with panic attacks.   Psychiatric/Behavioral:       Please refer to HPI    Medications: I have reviewed the patient's current medications.  Current Outpatient Medications  Medication Sig Dispense Refill  . ALPRAZolam (XANAX) 1 MG tablet Take 1/2 tab po TID and 1/2 tab po qd prn panic attacks 60 tablet 2  . buPROPion (WELLBUTRIN XL) 300 MG 24 hr tablet Take 300 mg by mouth daily.     . busPIRone (BUSPAR) 30 MG tablet Take 1 tablet (30 mg total) by mouth 2 (two) times daily. 180 tablet 1  .  diphenoxylate-atropine (LOMOTIL) 2.5-0.025 MG tablet Lomotil 2.5 mg-0.025 mg tablet  Take 2 tablets 4 times a day by oral route as needed.    . gabapentin (NEURONTIN) 600 MG tablet Take 1 tablet (600 mg total) by mouth 5 (five) times daily. 360 tablet 1  . HYDROcodone-acetaminophen (NORCO) 10-325 MG tablet Take 1 tablet by mouth every 6 (six) hours as needed.     Marland Kitchen levothyroxine (SYNTHROID, LEVOTHROID) 50 MCG  tablet Take 50 mcg by mouth daily.     . Loperamide HCl (IMODIUM PO) Take by mouth.    . Melatonin 10 MG TABS Take by mouth.    . Multiple Vitamin (MULTIVITAMIN WITH MINERALS) TABS tablet Take 1 tablet by mouth daily.    Marland Kitchen omeprazole (PRILOSEC) 40 MG capsule Take 40 mg by mouth daily.     . sertraline (ZOLOFT) 100 MG tablet TAKE 2 TABLETS BY MOUTH  DAILY 180 tablet 1  . Tetrahydrozoline HCl (VISINE OP) Place 1 drop into both eyes daily as needed (dry eyes).    Marland Kitchen tiZANidine (ZANAFLEX) 2 MG tablet      No current facility-administered medications for this visit.     Medication Side Effects: None  Allergies:  Allergies  Allergen Reactions  . Penicillins Anaphylaxis  . Sulfa Antibiotics Hives  . Vancomycin Diarrhea    Past Medical History:  Diagnosis Date  . Anxiety   . Arthritis   . Bladder cancer ([place]) 20 years ago  . Chronic fatigue   . Depression   . Fibromyalgia   . GERD (gastroesophageal reflux disease)   . History of hiatal hernia   . Hypothyroidism   . IBS (irritable bowel syndrome)   . PONV (postoperative nausea and vomiting)   . Spina bifida ([place])   . Urinary incontinence    From bladder cancer and radiation    Family History  Problem Relation Age of Onset  . Alzheimer's disease Father     Social History   Socioeconomic History  . Marital status: Married    Spouse name: Not on file  . Number of children: Not on file  . Years of education: Not on file  . Highest education level: Not on file  Occupational History  . Not on file  Social Needs  . Financial resource strain: Not on file  . Food insecurity    Worry: Not on file    Inability: Not on file  . Transportation needs    Medical: Not on file    Non-medical: Not on file  Tobacco Use  . Smoking status: Never Smoker  . Smokeless tobacco: Never Used  Substance and Sexual Activity  . Alcohol use: Yes    Comment: rare  . Drug use: No  . Sexual activity: Never  Lifestyle  . Physical activity     Days per week: Not on file    Minutes per session: Not on file  . Stress: Not on file  Relationships  . Social Herbalist on phone: Not on file    Gets together: Not on file    Attends religious service: Not on file    Active member of club or organization: Not on file    Attends meetings of clubs or organizations: Not on file    Relationship status: Not on file  . Intimate partner violence    Fear of current or ex partner: Not on file    Emotionally abused: Not on file    Physically abused: Not on file  Forced sexual activity: Not on file  Other Topics Concern  . Not on file  Social History Narrative  . Not on file    Past Medical History, Surgical history, Social history, and Family history were reviewed and updated as appropriate.   Please see review of systems for further details on the patient's review from today.   Objective:  Reports VS were WNL at last pain management visit.  Physical Exam:  There were no vitals taken for this visit.  Physical Exam Neurological:     Mental Status: She is alert and oriented to person, place, and time.     Cranial Nerves: No dysarthria.  Psychiatric:        Attention and Perception: Attention normal.        Mood and Affect: Mood is anxious and depressed.        Speech: Speech normal.        Behavior: Behavior is cooperative.        Thought Content: Thought content normal. Thought content is not paranoid or delusional. Thought content does not include homicidal or suicidal ideation. Thought content does not include homicidal or suicidal plan.        Cognition and Memory: Cognition and memory normal.        Judgment: Judgment normal.     Lab Review:     Component Value Date/Time   NA 136 12/27/2016 0436   K 3.6 12/27/2016 0436   CL 100 (L) 12/27/2016 0436   CO2 24 12/27/2016 0436   GLUCOSE 92 12/27/2016 0436   BUN <5 (L) 12/27/2016 0436   CREATININE 0.56 12/27/2016 0436   CALCIUM 8.4 (L) 12/27/2016 0436   PROT  6.7 12/26/2016 1205   ALBUMIN 3.0 (L) 12/26/2016 1205   AST 15 12/26/2016 1205   ALT 15 12/26/2016 1205   ALKPHOS 98 12/26/2016 1205   BILITOT 0.1 (L) 12/26/2016 1205   GFRNONAA >60 12/27/2016 0436   GFRAA >60 12/27/2016 0436       Component Value Date/Time   WBC 12.1 (H) 12/28/2016 0503   RBC 2.96 (L) 12/28/2016 0503   HGB 9.1 (L) 12/28/2016 0503   HCT 28.7 (L) 12/28/2016 0503   PLT 404 (H) 12/28/2016 0503   MCV 97.0 12/28/2016 0503   MCH 30.7 12/28/2016 0503   MCHC 31.7 12/28/2016 0503   RDW 15.1 12/28/2016 0503   LYMPHSABS 1.5 03/25/2014 1606   MONOABS 0.7 03/25/2014 1606   EOSABS 0.2 03/25/2014 1606   BASOSABS 0.0 03/25/2014 1606    No results found for: POCLITH, LITHIUM   No results found for: PHENYTOIN, PHENOBARB, VALPROATE, CBMZ   .res Assessment: Plan:   Will add an additional half tab that patient can take daily for panic signs and symptoms.  Advised patient not to combine alprazolam with pain medication and to inform pain management specialist of change in quantity of Xanax. Will continue sertraline for anxiety and depression. Will continue BuSpar 30 mg twice daily for anxiety. Wellbutrin prescribed by PCP. Patient to follow-up in 2 months or sooner if clinically indicated. Patient advised to contact office with any questions, adverse effects, or acute worsening in signs and symptoms.  Chrissy was seen today for anxiety and depression.  Diagnoses and all orders for this visit:  Anxiety -     ALPRAZolam (XANAX) 1 MG tablet; Take 1/2 tab po TID and 1/2 tab po qd prn panic attacks    Please see After Visit Summary for patient specific instructions.  No future appointments.  No orders of the defined types were placed in this encounter.     -------------------------------

## 2019-06-24 ENCOUNTER — Other Ambulatory Visit: Payer: Self-pay | Admitting: Psychiatry

## 2019-06-24 DIAGNOSIS — F419 Anxiety disorder, unspecified: Secondary | ICD-10-CM

## 2019-08-27 ENCOUNTER — Ambulatory Visit (INDEPENDENT_AMBULATORY_CARE_PROVIDER_SITE_OTHER): Payer: Medicare Other | Admitting: Psychiatry

## 2019-08-27 ENCOUNTER — Encounter: Payer: Self-pay | Admitting: Psychiatry

## 2019-08-27 DIAGNOSIS — F4321 Adjustment disorder with depressed mood: Secondary | ICD-10-CM | POA: Diagnosis not present

## 2019-08-27 DIAGNOSIS — F419 Anxiety disorder, unspecified: Secondary | ICD-10-CM

## 2019-08-27 DIAGNOSIS — F3289 Other specified depressive episodes: Secondary | ICD-10-CM | POA: Diagnosis not present

## 2019-08-27 MED ORDER — BUSPIRONE HCL 30 MG PO TABS: 30.0000 mg | ORAL_TABLET | Freq: Two times a day (BID) | ORAL | 1 refills | Status: DC

## 2019-08-27 MED ORDER — SERTRALINE HCL 100 MG PO TABS: ORAL_TABLET | ORAL | 1 refills | Status: DC

## 2019-08-27 MED ORDER — ALPRAZOLAM 1 MG PO TABS: ORAL_TABLET | ORAL | 2 refills | Status: DC

## 2019-08-27 MED ORDER — BUPROPION HCL ER (XL) 300 MG PO TB24: 300.0000 mg | ORAL_TABLET | Freq: Every day | ORAL | 1 refills | Status: DC

## 2019-08-27 NOTE — Progress Notes (Signed)
Natalie Hampton [id number] 31-Jan-1951 68 y.o.  Virtual Visit via Telephone Note  I connected with pt on 08/27/19 at  9:30 AM EST by telephone and verified that I am speaking with the correct person using two identifiers.   I discussed the limitations, risks, security and privacy concerns of performing an evaluation and management service by telephone and the availability of in person appointments. I also discussed with the patient that there may be a patient responsible charge related to this service. The patient expressed understanding and agreed to proceed.   I discussed the assessment and treatment plan with the patient. The patient was provided an opportunity to ask questions and all were answered. The patient agreed with the plan and demonstrated an understanding of the instructions.   The patient was advised to call back or seek an in-person evaluation if the symptoms worsen or if the condition fails to improve as anticipated.  I provided 25 minutes of non-face-to-face time during this encounter.  The patient was located at home.  The provider was located at [place].   Thayer Headings, PMHNP   Subjective:   Patient ID:  Natalie Hampton is a 68 y.o. (DOB 1951/03/17) female.  Chief Complaint:  Chief Complaint  Patient presents with  . Follow-up    Anxiety, Depression, Insomnia    HPI [name] presents for follow-up of anxiety and depression. She reports that she is "doing ok" considering that this year there have been multiple anniversaries of "firsts" without her husband and her brother that have died in the last year. She has been invited to Christmas at her nephew's and she is not comfortable with risk of exposure to [place]. She reports that she is not sure how to talk to her nephew about this and is afraid of upsetting him. She reports that she remains socially isolated and has not left her house except to go to pain management   She reports that additional 1/2 tab of Xanax has  been helpful for her anxiety and that she thinks that she is sleeping somewhat better. She reports that she continues to have some panic attacks and will try to work through these when they occur with prayer and deep breathing. She reports that now panic and anxiety are "manageable." She reports that her mood remains remain sad and crying at times in response to grief. She reports that she will talk to husband as if he were still alive and this gives her a sense of peace. She reports that her motivation is low most days and will put off doing some things. She has been trying to push herself to do things. Energy is chronically low with fibromyalgia. She reports that her appetite fluctuates from being very hungry to not wanting to eat. Appetite also varies based on GI s/s. She reports that GI and urinary s/s also cause anxiety about leaving home. She reports that she has been sleeping 4-6 hours consecutively some nights which is a significant improvement for her. She has other nights that her sleep is very disrupted. She reports some concentration difficulties and trouble recalling what she has just read or watched on TV. Will have to rewind TV shows at times. Denies SI.   She reports that she has neighbors and friends that help her. Mother is in ALF and has been in quarantine since March. Has not been able to see her mother and mother is severely HOH and cannot talk on the phone.   Past Psychiatric Medication Trials: Gabapentin-  Reports that this has been helpful for her neuropathy. Sertraline Paxil Effexor Cymbalta Prozac Buspar  Wellbutrin XL Xanax Trazodone.  Review of Systems:  Review of Systems  Gastrointestinal: Positive for diarrhea.  Genitourinary: Positive for dysuria.  Musculoskeletal: Positive for arthralgias, back pain, gait problem and myalgias.  Psychiatric/Behavioral:       Please refer to HPI    Medications: I have reviewed the patient's current medications.  Current  Outpatient Medications  Medication Sig Dispense Refill  . [START ON 09/01/2019] ALPRAZolam (XANAX) 1 MG tablet Take 1/2 tab po TID and 1/2 tab po qd prn panic attacks 60 tablet 2  . buPROPion (WELLBUTRIN XL) 300 MG 24 hr tablet Take 1 tablet (300 mg total) by mouth daily. 90 tablet 1  . busPIRone (BUSPAR) 30 MG tablet Take 1 tablet (30 mg total) by mouth 2 (two) times daily. 180 tablet 1  . diphenoxylate-atropine (LOMOTIL) 2.5-0.025 MG tablet Lomotil 2.5 mg-0.025 mg tablet  Take 2 tablets 4 times a day by oral route as needed.    . gabapentin (NEURONTIN) 600 MG tablet TAKE 1 TABLET BY MOUTH 4  TIMES DAILY 360 tablet 3  . HYDROcodone-acetaminophen (NORCO) 10-325 MG tablet Take 1 tablet by mouth every 6 (six) hours as needed.     Marland Kitchen levothyroxine (SYNTHROID, LEVOTHROID) 50 MCG tablet Take 50 mcg by mouth daily.     . Loperamide HCl (IMODIUM PO) Take by mouth.    . Melatonin 10 MG TABS Take by mouth.    . Multiple Vitamin (MULTIVITAMIN WITH MINERALS) TABS tablet Take 1 tablet by mouth daily.    Marland Kitchen omeprazole (PRILOSEC) 40 MG capsule Take 40 mg by mouth daily.     . sertraline (ZOLOFT) 100 MG tablet TAKE 2 TABLETS BY MOUTH  DAILY 180 tablet 1  . Tetrahydrozoline HCl (VISINE OP) Place 1 drop into both eyes daily as needed (dry eyes).    Marland Kitchen tiZANidine (ZANAFLEX) 2 MG tablet      No current facility-administered medications for this visit.    Medication Side Effects: None  Allergies:  Allergies  Allergen Reactions  . Penicillins Anaphylaxis  . Sulfa Antibiotics Hives  . Vancomycin Diarrhea    Past Medical History:  Diagnosis Date  . Anxiety   . Arthritis   . Bladder cancer ([place]) 20 years ago  . Chronic fatigue   . Depression   . Fibromyalgia   . GERD (gastroesophageal reflux disease)   . History of hiatal hernia   . Hypothyroidism   . IBS (irritable bowel syndrome)   . PONV (postoperative nausea and vomiting)   . Spina bifida ([place])   . Urinary incontinence    From bladder cancer  and radiation    Family History  Problem Relation Age of Onset  . Alzheimer's disease Father     Social History   Socioeconomic History  . Marital status: Married    Spouse name: Not on file  . Number of children: Not on file  . Years of education: Not on file  . Highest education level: Not on file  Occupational History  . Not on file  Tobacco Use  . Smoking status: Never Smoker  . Smokeless tobacco: Never Used  Substance and Sexual Activity  . Alcohol use: Yes    Comment: rare  . Drug use: No  . Sexual activity: Never  Other Topics Concern  . Not on file  Social History Narrative  . Not on file   Social Determinants of Health  Financial Resource Strain:   . Difficulty of Paying Living Expenses: Not on file  Food Insecurity:   . Worried About Charity fundraiser in the Last Year: Not on file  . Ran Out of Food in the Last Year: Not on file  Transportation Needs:   . Lack of Transportation (Medical): Not on file  . Lack of Transportation (Non-Medical): Not on file  Physical Activity:   . Days of Exercise per Week: Not on file  . Minutes of Exercise per Session: Not on file  Stress:   . Feeling of Stress : Not on file  Social Connections:   . Frequency of Communication with Friends and Family: Not on file  . Frequency of Social Gatherings with Friends and Family: Not on file  . Attends Religious Services: Not on file  . Active Member of Clubs or Organizations: Not on file  . Attends Archivist Meetings: Not on file  . Marital Status: Not on file  Intimate Partner Violence:   . Fear of Current or Ex-Partner: Not on file  . Emotionally Abused: Not on file  . Physically Abused: Not on file  . Sexually Abused: Not on file    Past Medical History, Surgical history, Social history, and Family history were reviewed and updated as appropriate.   Please see review of systems for further details on the patient's review from today.   Objective:    Physical Exam:  There were no vitals taken for this visit.  Physical Exam Neurological:     Mental Status: She is alert and oriented to person, place, and time.     Cranial Nerves: No dysarthria.  Psychiatric:        Attention and Perception: Attention and perception normal.        Mood and Affect: Mood is anxious and depressed.        Speech: Speech normal.        Behavior: Behavior is cooperative.        Thought Content: Thought content normal. Thought content is not paranoid or delusional. Thought content does not include homicidal or suicidal ideation. Thought content does not include homicidal or suicidal plan.        Cognition and Memory: Cognition and memory normal.        Judgment: Judgment normal.     Comments: Insight intact     Lab Review:     Component Value Date/Time   NA 136 12/27/2016 0436   K 3.6 12/27/2016 0436   CL 100 (L) 12/27/2016 0436   CO2 24 12/27/2016 0436   GLUCOSE 92 12/27/2016 0436   BUN <5 (L) 12/27/2016 0436   CREATININE 0.56 12/27/2016 0436   CALCIUM 8.4 (L) 12/27/2016 0436   PROT 6.7 12/26/2016 1205   ALBUMIN 3.0 (L) 12/26/2016 1205   AST 15 12/26/2016 1205   ALT 15 12/26/2016 1205   ALKPHOS 98 12/26/2016 1205   BILITOT 0.1 (L) 12/26/2016 1205   GFRNONAA >60 12/27/2016 0436   GFRAA >60 12/27/2016 0436       Component Value Date/Time   WBC 12.1 (H) 12/28/2016 0503   RBC 2.96 (L) 12/28/2016 0503   HGB 9.1 (L) 12/28/2016 0503   HCT 28.7 (L) 12/28/2016 0503   PLT 404 (H) 12/28/2016 0503   MCV 97.0 12/28/2016 0503   MCH 30.7 12/28/2016 0503   MCHC 31.7 12/28/2016 0503   RDW 15.1 12/28/2016 0503   LYMPHSABS 1.5 03/25/2014 1606   MONOABS 0.7 03/25/2014 1606  EOSABS 0.2 03/25/2014 1606   BASOSABS 0.0 03/25/2014 1606    No results found for: POCLITH, LITHIUM   No results found for: PHENYTOIN, PHENOBARB, VALPROATE, CBMZ   .res Assessment: Plan:   Pt seen for 30 minutes and greater than 50% of session spent counseling pt re:  recent stressors and grief. Discussed continuing to utilize support from family and neighbors. Will continue increase in Xanax since pt reports that this has been helpful for her.  Will continue current plan of care since target signs and symptoms are well controlled without any tolerability issues. Pt to f/u in 2-3 months or sooner if clinically indicated.  Patient advised to contact office with any questions, adverse effects, or acute worsening in signs and symptoms.  Ambert was seen today for follow-up.  Diagnoses and all orders for this visit:  Anxiety -     ALPRAZolam (XANAX) 1 MG tablet; Take 1/2 tab po TID and 1/2 tab po qd prn panic attacks -     busPIRone (BUSPAR) 30 MG tablet; Take 1 tablet (30 mg total) by mouth 2 (two) times daily. -     sertraline (ZOLOFT) 100 MG tablet; TAKE 2 TABLETS BY MOUTH  DAILY  Other depression -     sertraline (ZOLOFT) 100 MG tablet; TAKE 2 TABLETS BY MOUTH  DAILY -     buPROPion (WELLBUTRIN XL) 300 MG 24 hr tablet; Take 1 tablet (300 mg total) by mouth daily.  Grief    Please see After Visit Summary for patient specific instructions.  No future appointments.  No orders of the defined types were placed in this encounter.     -------------------------------

## 2019-11-20 ENCOUNTER — Other Ambulatory Visit: Payer: Self-pay | Admitting: Psychiatry

## 2019-11-20 DIAGNOSIS — F419 Anxiety disorder, unspecified: Secondary | ICD-10-CM

## 2019-11-20 NOTE — Telephone Encounter (Signed)
Next apt 03/31

## 2019-12-09 ENCOUNTER — Ambulatory Visit: Payer: Medicare Other | Admitting: Physician Assistant

## 2019-12-09 ENCOUNTER — Encounter: Payer: Self-pay | Admitting: Physician Assistant

## 2019-12-09 NOTE — Progress Notes (Signed)
Pt was put on my sched by mistake.  Not having probs and prefers to R/S to see her provider.

## 2019-12-18 ENCOUNTER — Ambulatory Visit (INDEPENDENT_AMBULATORY_CARE_PROVIDER_SITE_OTHER): Payer: Medicare Other | Admitting: Psychiatry

## 2019-12-18 ENCOUNTER — Encounter: Payer: Self-pay | Admitting: Psychiatry

## 2019-12-18 VITALS — BP 115/68

## 2019-12-18 DIAGNOSIS — F419 Anxiety disorder, unspecified: Secondary | ICD-10-CM

## 2019-12-18 DIAGNOSIS — F3289 Other specified depressive episodes: Secondary | ICD-10-CM

## 2019-12-18 DIAGNOSIS — F4321 Adjustment disorder with depressed mood: Secondary | ICD-10-CM | POA: Diagnosis not present

## 2019-12-18 MED ORDER — BUPROPION HCL ER (XL) 300 MG PO TB24: 300.0000 mg | ORAL_TABLET | Freq: Every day | ORAL | 1 refills | Status: DC

## 2019-12-18 MED ORDER — SERTRALINE HCL 100 MG PO TABS: ORAL_TABLET | ORAL | 1 refills | Status: DC

## 2019-12-18 MED ORDER — GABAPENTIN 600 MG PO TABS: ORAL_TABLET | ORAL | 3 refills | Status: DC

## 2019-12-18 MED ORDER — BUSPIRONE HCL 30 MG PO TABS: 30.0000 mg | ORAL_TABLET | Freq: Two times a day (BID) | ORAL | 1 refills | Status: DC

## 2019-12-18 NOTE — Progress Notes (Signed)
Keyundra Vickrey [id number] August 14, 1951 69 y.o.  Virtual Visit via Telephone Note  I connected with pt on 12/18/19 at 11:30 AM EDT by telephone and verified that I am speaking with the correct person using two identifiers.   I discussed the limitations, risks, security and privacy concerns of performing an evaluation and management service by telephone and the availability of in person appointments. I also discussed with the patient that there may be a patient responsible charge related to this service. The patient expressed understanding and agreed to proceed.   I discussed the assessment and treatment plan with the patient. The patient was provided an opportunity to ask questions and all were answered. The patient agreed with the plan and demonstrated an understanding of the instructions.   The patient was advised to call back or seek an in-person evaluation if the symptoms worsen or if the condition fails to improve as anticipated.  I provided 25 minutes of non-face-to-face time during this encounter.  The patient was located at home.  The provider was located at [place].   Thayer Headings, PMHNP   Subjective:   Patient ID:  Natalie Hampton is a 69 y.o. (DOB May 15, 1951) female.  Chief Complaint:  Chief Complaint  Patient presents with  . Follow-up    h/o Anxiety and Depression    HPI Natalie Hampton presents for follow-up of anxiety and depression. She reports that she has experienced some relief after covid vaccinations. She reports that she continues to feel anxious when she has to be seen by pain management. She reports anxiety with leaving her house. She reports that she has to have someone take her to appointments and then has to take her w/c. She reports having moments of anxiety. She reports having some panic s/s and will try to focus on her breathing and will pray. She reports that Xanax is helpful. "I'm not all that depressed, but sometimes I get aggravated and tired of everything."  She reports, "I'm a lot better than I was a year ago." She reports that her sleep has been adequate except for diminished sleep the last 5-6 nights. She reports that her appetite is average to below average. Typically eats when she needs to eat. She reports that she does as much as she can and that it is occasionally hard to get motivated to do some things. Concentration has been adequate. Has been trying to read daily. She reports that she is enjoying reading. Denies SI.   She reports that her neighbor checks on her daily and helps her with errands and cleaning her home.   Past Psychiatric Medication Trials: Gabapentin- Reports that this has been helpful for her neuropathy. Sertraline Paxil Effexor Cymbalta Prozac Buspar  Wellbutrin XL Xanax Trazodone.  Review of Systems:  Review of Systems  Eyes: Positive for visual disturbance.  Gastrointestinal:       IBS s/s  Musculoskeletal: Negative for gait problem.  Neurological: Negative for tremors.  Psychiatric/Behavioral:       Please refer to HPI    Has had both COVID vaccinations.  Medications: I have reviewed the patient's current medications.  Current Outpatient Medications  Medication Sig Dispense Refill  . ALPRAZolam (XANAX) 1 MG tablet TAKE 1/2 TABLET BY MOUTH 3 TIMES A DAY AND 1/2 TABLET DAILY AS NEEDED FOR PANIC ATTACKS 60 tablet 2  . buPROPion (WELLBUTRIN XL) 300 MG 24 hr tablet Take 1 tablet (300 mg total) by mouth daily. 90 tablet 1  . busPIRone (BUSPAR) 30 MG tablet Take  1 tablet (30 mg total) by mouth 2 (two) times daily. 180 tablet 1  . diphenoxylate-atropine (LOMOTIL) 2.5-0.025 MG tablet Lomotil 2.5 mg-0.025 mg tablet  Take 2 tablets 4 times a day by oral route as needed.    . gabapentin (NEURONTIN) 600 MG tablet TAKE 1 TABLET BY MOUTH 4  TIMES DAILY 360 tablet 3  . HYDROcodone-acetaminophen (NORCO) 10-325 MG tablet Take 1 tablet by mouth every 6 (six) hours as needed.     Marland Kitchen levothyroxine (SYNTHROID, LEVOTHROID)  50 MCG tablet Take 50 mcg by mouth daily.     . Loperamide HCl (IMODIUM PO) Take by mouth.    . Melatonin 10 MG TABS Take by mouth.    . Multiple Vitamin (MULTIVITAMIN WITH MINERALS) TABS tablet Take 1 tablet by mouth daily.    Marland Kitchen omeprazole (PRILOSEC) 40 MG capsule Take 40 mg by mouth daily.     . sertraline (ZOLOFT) 100 MG tablet TAKE 2 TABLETS BY MOUTH  DAILY 180 tablet 1  . Tetrahydrozoline HCl (VISINE OP) Place 1 drop into both eyes daily as needed (dry eyes).    Marland Kitchen tiZANidine (ZANAFLEX) 2 MG tablet      No current facility-administered medications for this visit.    Medication Side Effects: None  Allergies:  Allergies  Allergen Reactions  . Penicillins Anaphylaxis  . Sulfa Antibiotics Hives  . Vancomycin Diarrhea    Past Medical History:  Diagnosis Date  . Anxiety   . Arthritis   . Bladder cancer ([place]) 20 years ago  . Chronic fatigue   . Depression   . Fibromyalgia   . GERD (gastroesophageal reflux disease)   . History of hiatal hernia   . Hypothyroidism   . IBS (irritable bowel syndrome)   . PONV (postoperative nausea and vomiting)   . Spina bifida ([place])   . Urinary incontinence    From bladder cancer and radiation    Family History  Problem Relation Age of Onset  . Alzheimer's disease Father     Social History   Socioeconomic History  . Marital status: Married    Spouse name: Not on file  . Number of children: Not on file  . Years of education: Not on file  . Highest education level: Not on file  Occupational History  . Not on file  Tobacco Use  . Smoking status: Never Smoker  . Smokeless tobacco: Never Used  Substance and Sexual Activity  . Alcohol use: Yes    Comment: rare  . Drug use: No  . Sexual activity: Never  Other Topics Concern  . Not on file  Social History Narrative  . Not on file   Social Determinants of Health   Financial Resource Strain:   . Difficulty of Paying Living Expenses:   Food Insecurity:   . Worried About Paediatric nurse in the Last Year:   . Arboriculturist in the Last Year:   Transportation Needs:   . Film/video editor (Medical):   Marland Kitchen Lack of Transportation (Non-Medical):   Physical Activity:   . Days of Exercise per Week:   . Minutes of Exercise per Session:   Stress:   . Feeling of Stress :   Social Connections:   . Frequency of Communication with Friends and Family:   . Frequency of Social Gatherings with Friends and Family:   . Attends Religious Services:   . Active Member of Clubs or Organizations:   . Attends Archivist Meetings:   .  Marital Status:   Intimate Partner Violence:   . Fear of Current or Ex-Partner:   . Emotionally Abused:   Marland Kitchen Physically Abused:   . Sexually Abused:     Past Medical History, Surgical history, Social history, and Family history were reviewed and updated as appropriate.   Please see review of systems for further details on the patient's review from today.   Objective:   Physical Exam:  BP 115/68   Physical Exam Neurological:     Mental Status: She is alert and oriented to person, place, and time.     Cranial Nerves: No dysarthria.  Psychiatric:        Attention and Perception: Attention and perception normal.        Mood and Affect: Mood is anxious.        Speech: Speech normal.        Behavior: Behavior is cooperative.        Thought Content: Thought content normal. Thought content is not paranoid or delusional. Thought content does not include homicidal or suicidal ideation. Thought content does not include homicidal or suicidal plan.        Cognition and Memory: Cognition and memory normal.        Judgment: Judgment normal.     Comments: Insight intact Mood presents as less anxious and less depressed compared to most recent exams.     Lab Review:     Component Value Date/Time   NA 136 12/27/2016 0436   K 3.6 12/27/2016 0436   CL 100 (L) 12/27/2016 0436   CO2 24 12/27/2016 0436   GLUCOSE 92 12/27/2016 0436   BUN  <5 (L) 12/27/2016 0436   CREATININE 0.56 12/27/2016 0436   CALCIUM 8.4 (L) 12/27/2016 0436   PROT 6.7 12/26/2016 1205   ALBUMIN 3.0 (L) 12/26/2016 1205   AST 15 12/26/2016 1205   ALT 15 12/26/2016 1205   ALKPHOS 98 12/26/2016 1205   BILITOT 0.1 (L) 12/26/2016 1205   GFRNONAA >60 12/27/2016 0436   GFRAA >60 12/27/2016 0436       Component Value Date/Time   WBC 12.1 (H) 12/28/2016 0503   RBC 2.96 (L) 12/28/2016 0503   HGB 9.1 (L) 12/28/2016 0503   HCT 28.7 (L) 12/28/2016 0503   PLT 404 (H) 12/28/2016 0503   MCV 97.0 12/28/2016 0503   MCH 30.7 12/28/2016 0503   MCHC 31.7 12/28/2016 0503   RDW 15.1 12/28/2016 0503   LYMPHSABS 1.5 03/25/2014 1606   MONOABS 0.7 03/25/2014 1606   EOSABS 0.2 03/25/2014 1606   BASOSABS 0.0 03/25/2014 1606    No results found for: POCLITH, LITHIUM   No results found for: PHENYTOIN, PHENOBARB, VALPROATE, CBMZ   .res Assessment: Plan:   Will continue current plan of care since target signs and symptoms are well controlled without any tolerability issues. Patient has refills remaining of alprazolam at this time and will con at her pharmacy she additional Patient to follow up in 4 months or sooner if clinically indicated. Patient advised to contact office with any questions, adverse effects, or acute worsening in signs and symptoms.  Natalie Hampton was seen today for follow-up.  Diagnoses and all orders for this visit:  Anxiety -     busPIRone (BUSPAR) 30 MG tablet; Take 1 tablet (30 mg total) by mouth 2 (two) times daily. -     gabapentin (NEURONTIN) 600 MG tablet; TAKE 1 TABLET BY MOUTH 4  TIMES DAILY -     sertraline (ZOLOFT) 100 MG  tablet; TAKE 2 TABLETS BY MOUTH  DAILY  Other depression -     buPROPion (WELLBUTRIN XL) 300 MG 24 hr tablet; Take 1 tablet (300 mg total) by mouth daily. -     sertraline (ZOLOFT) 100 MG tablet; TAKE 2 TABLETS BY MOUTH  DAILY  Grief    Please see After Visit Summary for patient specific instructions.  No future  appointments.  No orders of the defined types were placed in this encounter.     -------------------------------

## 2020-02-17 ENCOUNTER — Other Ambulatory Visit: Payer: Self-pay | Admitting: Psychiatry

## 2020-02-17 DIAGNOSIS — F419 Anxiety disorder, unspecified: Secondary | ICD-10-CM

## 2020-02-18 NOTE — Telephone Encounter (Signed)
Last apt 12/18/2019

## 2020-04-27 ENCOUNTER — Other Ambulatory Visit: Payer: Self-pay | Admitting: Psychiatry

## 2020-04-27 DIAGNOSIS — F419 Anxiety disorder, unspecified: Secondary | ICD-10-CM

## 2020-04-27 DIAGNOSIS — F3289 Other specified depressive episodes: Secondary | ICD-10-CM

## 2020-05-11 ENCOUNTER — Other Ambulatory Visit: Payer: Self-pay | Admitting: Psychiatry

## 2020-05-11 DIAGNOSIS — F419 Anxiety disorder, unspecified: Secondary | ICD-10-CM

## 2020-05-11 NOTE — Telephone Encounter (Signed)
Last apt 12/2019 due back now

## 2020-06-08 ENCOUNTER — Other Ambulatory Visit: Payer: Self-pay | Admitting: Psychiatry

## 2020-06-08 DIAGNOSIS — F419 Anxiety disorder, unspecified: Secondary | ICD-10-CM

## 2020-06-08 NOTE — Telephone Encounter (Signed)
Last apt 12/2019, no apt scheduled

## 2020-07-05 ENCOUNTER — Telehealth: Payer: Self-pay | Admitting: Psychiatry

## 2020-07-05 DIAGNOSIS — F419 Anxiety disorder, unspecified: Secondary | ICD-10-CM

## 2020-07-05 DIAGNOSIS — F3289 Other specified depressive episodes: Secondary | ICD-10-CM

## 2020-07-05 MED ORDER — GABAPENTIN 600 MG PO TABS: ORAL_TABLET | ORAL | 0 refills | Status: DC

## 2020-07-05 MED ORDER — SERTRALINE HCL 100 MG PO TABS: 200.0000 mg | ORAL_TABLET | Freq: Every day | ORAL | 0 refills | Status: DC

## 2020-07-05 MED ORDER — BUSPIRONE HCL 30 MG PO TABS: 30.0000 mg | ORAL_TABLET | Freq: Two times a day (BID) | ORAL | 1 refills | Status: DC

## 2020-07-05 MED ORDER — BUPROPION HCL ER (XL) 300 MG PO TB24: 300.0000 mg | ORAL_TABLET | Freq: Every day | ORAL | 0 refills | Status: DC

## 2020-07-05 MED ORDER — ALPRAZOLAM 1 MG PO TABS: ORAL_TABLET | ORAL | 2 refills | Status: DC

## 2020-07-05 NOTE — Telephone Encounter (Signed)
Refills sent on all meds for 90 days.

## 2020-07-05 NOTE — Telephone Encounter (Signed)
Dionicia Alegria called to get refills on all her medications but informed that she now has Company secretary (form of Medicaid) I told her that we don't accept [facility] and she started crying and said that she needs all her medications refilled. She said she is trying to get [facility] insurance again but still doesn't have it. I told her I would send you a message to see if her meds could be called in at least once until she can get another insurance started. Her meds are Gabapentin, Xanax, Wellbutrin, Buspsar and Zoloft. Needs cqalled in to [facility], [place], [place], [place].

## 2020-07-05 NOTE — Telephone Encounter (Signed)
Rtc to patient, very tearful on phone and said hello and thank you to [place]. She's in the middle of changing mail order pharmacies with her insurance and seemed upset about them. Informed her to call back if there was anything else we could do.

## 2020-09-30 ENCOUNTER — Other Ambulatory Visit: Payer: Self-pay | Admitting: Psychiatry

## 2020-09-30 DIAGNOSIS — F419 Anxiety disorder, unspecified: Secondary | ICD-10-CM

## 2020-10-03 ENCOUNTER — Other Ambulatory Visit: Payer: Self-pay | Admitting: Psychiatry

## 2020-10-03 DIAGNOSIS — F3289 Other specified depressive episodes: Secondary | ICD-10-CM

## 2020-12-28 ENCOUNTER — Other Ambulatory Visit: Payer: Self-pay | Admitting: Psychiatry

## 2020-12-28 DIAGNOSIS — F419 Anxiety disorder, unspecified: Secondary | ICD-10-CM

## 2020-12-28 NOTE — Telephone Encounter (Signed)
Controlled substance 

## 2021-01-29 ENCOUNTER — Other Ambulatory Visit: Payer: Self-pay | Admitting: Psychiatry

## 2021-01-29 DIAGNOSIS — F3289 Other specified depressive episodes: Secondary | ICD-10-CM

## 2021-02-04 NOTE — Telephone Encounter (Signed)
Last apt 12/2019 was due back at 3 months

## 2021-03-23 ENCOUNTER — Other Ambulatory Visit: Payer: Self-pay | Admitting: Psychiatry

## 2021-03-23 DIAGNOSIS — F419 Anxiety disorder, unspecified: Secondary | ICD-10-CM

## 2021-03-23 NOTE — Telephone Encounter (Signed)
Please schedule appt

## 2021-03-24 NOTE — Telephone Encounter (Signed)
Pt made an appt for 8/24

## 2021-03-24 NOTE — Telephone Encounter (Signed)
Last filled 6/17 appt on 8/24

## 2021-05-03 ENCOUNTER — Other Ambulatory Visit: Payer: Self-pay

## 2021-05-03 ENCOUNTER — Ambulatory Visit (INDEPENDENT_AMBULATORY_CARE_PROVIDER_SITE_OTHER): Payer: Medicare Other | Admitting: Psychiatry

## 2021-05-03 ENCOUNTER — Encounter: Payer: Self-pay | Admitting: Psychiatry

## 2021-05-03 DIAGNOSIS — F419 Anxiety disorder, unspecified: Secondary | ICD-10-CM

## 2021-05-03 DIAGNOSIS — F3289 Other specified depressive episodes: Secondary | ICD-10-CM

## 2021-05-03 MED ORDER — SERTRALINE HCL 100 MG PO TABS: 200.0000 mg | ORAL_TABLET | Freq: Every day | ORAL | 3 refills | Status: DC

## 2021-05-03 MED ORDER — ALPRAZOLAM 1 MG PO TABS: ORAL_TABLET | ORAL | 5 refills | Status: DC

## 2021-05-03 MED ORDER — GABAPENTIN 600 MG PO TABS
600.0000 mg | ORAL_TABLET | Freq: Four times a day (QID) | ORAL | 1 refills | Status: DC
Start: 2021-05-03 — End: 2021-11-02

## 2021-05-03 MED ORDER — BUSPIRONE HCL 30 MG PO TABS: 30.0000 mg | ORAL_TABLET | Freq: Two times a day (BID) | ORAL | 3 refills | Status: DC

## 2021-05-03 NOTE — Progress Notes (Signed)
Natalie Hampton [id number] 1951-01-01 70 y.o.  Subjective:   Patient ID:  Natalie Hampton is a 70 y.o. (DOB Dec 27, 1950) female.  Chief Complaint:  Chief Complaint  Patient presents with   Anxiety   Follow-up    Depression    HPI Natalie Hampton presents to the office today for follow-up of anxiety and depression. She reports "I stay by myself all the time." She reports that she has difficulty asking for help from family "but I know I have people to help me. She uses delivery services for groceries.   Mother died 28-Sep-2022 at age 71 and Natalie Hampton had COVID immediately after that. "I was so sick, I don't remember anything." Has been grieving the loss of her mother and husband. Husband died October 14, 2018 and brother died in 2017-10-14. Her aunts and uncles are deceased as well. "I feel kind of lost... I wonder if I am going to be the next one." She reports periods of depression. She reports motivation and energy are fair- "I do what I need to do." She tries to do her own laundry and things to take care of herself.   She reports anxiety. Describes feeling nervous most of the time. She reports panic attacks daily. She reports that panic attacks are sometimes in response to trigger and other times occur without a trigger. She reports frequent worry. Worry will sometimes lead to panic. She was having trouble sleeping and this has improved. She reports that her sleep is occasionally disrupted due to anxious thoughts. She reports that she has not been eating "right" and will forget to eat. Drinking a protein shake every morning. Reports weight has been stable. Family will bring her meals at times. Concentration is "mostly good." Occasionally will try to read a magazine and cannot focus and will come back to it later. Denies SI.   Nephew transported her to apt and helps her out. He and his mother live nearby. They have sold their mom's house.    Past Psychiatric Medication Trials: Gabapentin- Reports that this has been helpful  for her neuropathy. Sertraline Paxil Effexor Cymbalta Prozac Buspar  Wellbutrin XL Xanax Trazodone  Review of Systems:  Review of Systems  Gastrointestinal:  Positive for diarrhea.       Chronic GI s/s with IBS-D  Musculoskeletal:  Positive for gait problem.  Psychiatric/Behavioral:         Please refer to HPI   Medications: I have reviewed the patient's current medications.  Current Outpatient Medications  Medication Sig Dispense Refill   buPROPion (WELLBUTRIN XL) 300 MG 24 hr tablet TAKE 1 TABLET BY MOUTH  DAILY 90 tablet 3   diphenoxylate-atropine (LOMOTIL) 2.5-0.025 MG tablet Lomotil 2.5 mg-0.025 mg tablet  Take 2 tablets 4 times a day by oral route as needed.     HYDROcodone-acetaminophen (NORCO) 10-325 MG tablet Take 1 tablet by mouth every 6 (six) hours as needed.      levothyroxine (SYNTHROID, LEVOTHROID) 50 MCG tablet Take 50 mcg by mouth daily.      Loperamide HCl (IMODIUM PO) Take by mouth.     Melatonin 10 MG TABS Take by mouth.     Multiple Vitamin (MULTIVITAMIN WITH MINERALS) TABS tablet Take 1 tablet by mouth daily.     omeprazole (PRILOSEC) 40 MG capsule Take 40 mg by mouth daily.      Tetrahydrozoline HCl (VISINE OP) Place 1 drop into both eyes daily as needed (dry eyes).     tiZANidine (ZANAFLEX) 2 MG tablet      [  START ON 06/16/2021] ALPRAZolam (XANAX) 1 MG tablet TAKE 1/2 TABLET BY MOUTH 3 TIMES A DAY AND 1/2 TABLET DAILY AS NEEDED FOR PANIC ATTACKS 60 tablet 5   busPIRone (BUSPAR) 30 MG tablet Take 1 tablet (30 mg total) by mouth 2 (two) times daily. 180 tablet 3   gabapentin (NEURONTIN) 600 MG tablet Take 1 tablet (600 mg total) by mouth 4 (four) times daily. TAKE 1 TABLET BY MOUTH 4  TIMES DAILY 360 tablet 1   sertraline (ZOLOFT) 100 MG tablet Take 2 tablets (200 mg total) by mouth daily. 180 tablet 3   No current facility-administered medications for this visit.    Medication Side Effects: None  Allergies:  Allergies  Allergen Reactions    Penicillins Anaphylaxis   Sulfa Antibiotics Hives   Vancomycin Diarrhea    Past Medical History:  Diagnosis Date   Anxiety    Arthritis    Bladder cancer ([place]) 20 years ago   Chronic fatigue    Depression    Fibromyalgia    GERD (gastroesophageal reflux disease)    History of hiatal hernia    Hypothyroidism    IBS (irritable bowel syndrome)    PONV (postoperative nausea and vomiting)    Spina bifida ([place])    Urinary incontinence    From bladder cancer and radiation    Past Medical History, Surgical history, Social history, and Family history were reviewed and updated as appropriate.   Please see review of systems for further details on the patient's review from today.   Objective:   Physical Exam:  BP 134/79   Pulse 79   Physical Exam Constitutional:      General: She is not in acute distress. Musculoskeletal:     Comments: In w/c  Neurological:     Mental Status: She is alert and oriented to person, place, and time.     Coordination: Coordination normal.  Psychiatric:        Attention and Perception: Attention and perception normal. She does not perceive auditory or visual hallucinations.        Mood and Affect: Mood is anxious. Mood is not depressed. Affect is not labile, blunt, angry or inappropriate.        Speech: Speech normal.        Behavior: Behavior normal.        Thought Content: Thought content normal. Thought content is not paranoid or delusional. Thought content does not include homicidal or suicidal ideation. Thought content does not include homicidal or suicidal plan.        Cognition and Memory: Cognition and memory normal.        Judgment: Judgment normal.     Comments: Insight intact    Lab Review:     Component Value Date/Time   NA 136 12/27/2016 0436   K 3.6 12/27/2016 0436   CL 100 (L) 12/27/2016 0436   CO2 24 12/27/2016 0436   GLUCOSE 92 12/27/2016 0436   BUN <5 (L) 12/27/2016 0436   CREATININE 0.56 12/27/2016 0436   CALCIUM 8.4 (L)  12/27/2016 0436   PROT 6.7 12/26/2016 1205   ALBUMIN 3.0 (L) 12/26/2016 1205   AST 15 12/26/2016 1205   ALT 15 12/26/2016 1205   ALKPHOS 98 12/26/2016 1205   BILITOT 0.1 (L) 12/26/2016 1205   GFRNONAA >60 12/27/2016 0436   GFRAA >60 12/27/2016 0436       Component Value Date/Time   WBC 12.1 (H) 12/28/2016 0503   RBC 2.96 (L) 12/28/2016 0503  HGB 9.1 (L) 12/28/2016 0503   HCT 28.7 (L) 12/28/2016 0503   PLT 404 (H) 12/28/2016 0503   MCV 97.0 12/28/2016 0503   MCH 30.7 12/28/2016 0503   MCHC 31.7 12/28/2016 0503   RDW 15.1 12/28/2016 0503   LYMPHSABS 1.5 03/25/2014 1606   MONOABS 0.7 03/25/2014 1606   EOSABS 0.2 03/25/2014 1606   BASOSABS 0.0 03/25/2014 1606    No results found for: POCLITH, LITHIUM   No results found for: PHENYTOIN, PHENOBARB, VALPROATE, CBMZ   .res Assessment: Plan:    Patient seen for 30 minutes and time spent discussing treatment options.  She reports that she continues to experience grief due to multiple losses and some anxiety, however she reports that medications have been helpful for her mood and anxiety and prefers to continue current medications. Will continue sertraline 200 mg daily for anxiety and depression. Continue gabapentin 600 mg 4 times daily for anxiety. Continue BuSpar 30 mg twice daily for anxiety. Continue alprazolam 1 mg 1/2 tablet 3 times daily and an additional half tablet as needed for panic. Patient to follow-up in 6 months or sooner if clinically indicated. Patient advised to contact office with any questions, adverse effects, or acute worsening in signs and symptoms.   Roxi was seen today for anxiety and follow-up.  Diagnoses and all orders for this visit:  Anxiety -     gabapentin (NEURONTIN) 600 MG tablet; Take 1 tablet (600 mg total) by mouth 4 (four) times daily. TAKE 1 TABLET BY MOUTH 4  TIMES DAILY -     ALPRAZolam (XANAX) 1 MG tablet; TAKE 1/2 TABLET BY MOUTH 3 TIMES A DAY AND 1/2 TABLET DAILY AS NEEDED FOR PANIC  ATTACKS -     busPIRone (BUSPAR) 30 MG tablet; Take 1 tablet (30 mg total) by mouth 2 (two) times daily. -     sertraline (ZOLOFT) 100 MG tablet; Take 2 tablets (200 mg total) by mouth daily.  Other depression -     sertraline (ZOLOFT) 100 MG tablet; Take 2 tablets (200 mg total) by mouth daily.    Please see After Visit Summary for patient specific instructions.  Future Appointments  Date Time Provider [place]  11/02/2021  2:00 PM Thayer Headings, PMHNP [facility] None    No orders of the defined types were placed in this encounter.   -------------------------------

## 2021-11-02 ENCOUNTER — Encounter: Payer: Self-pay | Admitting: Psychiatry

## 2021-11-02 ENCOUNTER — Ambulatory Visit (INDEPENDENT_AMBULATORY_CARE_PROVIDER_SITE_OTHER): Payer: Medicare Other | Admitting: Psychiatry

## 2021-11-02 DIAGNOSIS — F419 Anxiety disorder, unspecified: Secondary | ICD-10-CM

## 2021-11-02 DIAGNOSIS — F3289 Other specified depressive episodes: Secondary | ICD-10-CM

## 2021-11-02 MED ORDER — BUPROPION HCL ER (XL) 300 MG PO TB24: 300.0000 mg | ORAL_TABLET | Freq: Every day | ORAL | 3 refills | Status: DC

## 2021-11-02 MED ORDER — ALPRAZOLAM 1 MG PO TABS: ORAL_TABLET | ORAL | 5 refills | Status: DC

## 2021-11-02 MED ORDER — GABAPENTIN 600 MG PO TABS: 600.0000 mg | ORAL_TABLET | Freq: Four times a day (QID) | ORAL | 1 refills | Status: DC

## 2021-11-02 MED ORDER — SERTRALINE HCL 100 MG PO TABS: 200.0000 mg | ORAL_TABLET | Freq: Every day | ORAL | 3 refills | Status: DC

## 2021-11-02 NOTE — Progress Notes (Signed)
Natalie Hampton [id number] [birth year] 71 y.o.  Virtual Visit via Telephone Note  I connected with pt on 11/02/21 at  2:00 PM EST by telephone and verified that I am speaking with the correct person using two identifiers.   I discussed the limitations, risks, security and privacy concerns of performing an evaluation and management service by telephone and the availability of in person appointments. I also discussed with the patient that there may be a patient responsible charge related to this service. The patient expressed understanding and agreed to proceed.   I discussed the assessment and treatment plan with the patient. The patient was provided an opportunity to ask questions and all were answered. The patient agreed with the plan and demonstrated an understanding of the instructions.   The patient was advised to call back or seek an in-person evaluation if the symptoms worsen or if the condition fails to improve as anticipated.  I provided 22 minutes of non-face-to-face time during this encounter.  The patient was located at home.  The provider was located at [place].   Thayer Headings, PMHNP   Subjective:   Patient ID:  Natalie Hampton is a 71 y.o. (DOB December 18, 1950) female.  Chief Complaint:  Chief Complaint  Patient presents with   Anxiety   Depression   Insomnia    HPI Natalie Hampton presents for follow-up of anxiety, depression, and insomnia. She reports that she has been "going through some rough times." She reports that one of her best friends died a few weeks ago. She reports that she has some financial stress and family is helping her. She reports that multiple repairs have needed to be made at home and this has exacerbated her financial stress. She reports, "I'm a nervous wreck." She reports exaggerated startle response and "feel like I am on edge all the time." She reports that she has been having panic attacks. She reports feeling "scared." She reports excessive worry and  catastrophic thinking. She reports that she spends most of her time alone.   She reports that she has had crying episodes at times. She reports that she has periods of anger. She reports feeling sad at times. "I feel like my whole life is behind me... where did the happiness go?" She reports that she has lost close family members and now close friend. She reports poor sleep and reports that her sleep schedule has been off. She reports that she has been taking Melatonin. She reports that her appetite has been decreased. She reports feeling "guilty for eating" since she feels her money needs to be put towards repairs. She reports that her energy is low. She reports that motivation is lower. She reports poor concentration and focus. She reports trying to play games and do activities to help with concentration. Denies SI.   She reports that she has been out of some of her medications for a couple of weeks. She reports that re-starting medication should be helpful for her s/s. Has been out of Gabapentin for 1-2  weeks.  Sister-in-law received a promotion at work and is no longer able to come by every afternoon like she did in the past. 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 She reports that family is willing to help when she asks.   She reports that her dog is a source of support and comfort. Dog will be 66 yo soon.   Past Psychiatric Medication Trials: Gabapentin- Reports that this has been helpful for her neuropathy. Sertraline  Paxil Effexor Cymbalta Prozac Buspar  Wellbutrin XL Xanax Trazodone  Review of Systems:  Review of Systems  Gastrointestinal:        IBS-D  Genitourinary:        Recurrent UTI's  Musculoskeletal:  Positive for arthralgias and myalgias.       Knee pain  Neurological:  Positive for tremors. Negative for headaches.  Psychiatric/Behavioral:         Please refer to HPI   Medications: I have reviewed the patient's current medications.  Current Outpatient  Medications  Medication Sig Dispense Refill   busPIRone (BUSPAR) 30 MG tablet Take 1 tablet (30 mg total) by mouth 2 (two) times daily. 180 tablet 3   diphenoxylate-atropine (LOMOTIL) 2.5-0.025 MG tablet Lomotil 2.5 mg-0.025 mg tablet  Take 2 tablets 4 times a day by oral route as needed.     HYDROcodone-acetaminophen (NORCO) 10-325 MG tablet Take 1 tablet by mouth every 6 (six) hours as needed.      levothyroxine (SYNTHROID, LEVOTHROID) 50 MCG tablet Take 50 mcg by mouth daily.      Loperamide HCl (IMODIUM PO) Take by mouth.     Melatonin 10 MG TABS Take by mouth.     Multiple Vitamin (MULTIVITAMIN WITH MINERALS) TABS tablet Take 1 tablet by mouth daily.     omeprazole (PRILOSEC) 40 MG capsule Take 40 mg by mouth daily.      Tetrahydrozoline HCl (VISINE OP) Place 1 drop into both eyes daily as needed (dry eyes).     tiZANidine (ZANAFLEX) 2 MG tablet      [START ON 11/10/2021] ALPRAZolam (XANAX) 1 MG tablet TAKE 1/2 TABLET BY MOUTH 3 TIMES A DAY AND 1/2 TABLET DAILY AS NEEDED FOR PANIC ATTACKS 60 tablet 5   buPROPion (WELLBUTRIN XL) 300 MG 24 hr tablet Take 1 tablet (300 mg total) by mouth daily. 90 tablet 3   gabapentin (NEURONTIN) 600 MG tablet Take 1 tablet (600 mg total) by mouth 4 (four) times daily. TAKE 1 TABLET BY MOUTH 4  TIMES DAILY 360 tablet 1   sertraline (ZOLOFT) 100 MG tablet Take 2 tablets (200 mg total) by mouth daily. 180 tablet 3   No current facility-administered medications for this visit.    Medication Side Effects: None  Allergies:  Allergies  Allergen Reactions   Penicillins Anaphylaxis   Sulfa Antibiotics Hives   Vancomycin Diarrhea    Past Medical History:  Diagnosis Date   Anxiety    Arthritis    Bladder cancer ([place]) 20 years ago   Chronic fatigue    Depression    Fibromyalgia    GERD (gastroesophageal reflux disease)    History of hiatal hernia    Hypothyroidism    IBS (irritable bowel syndrome)    PONV (postoperative nausea and vomiting)    Spina  bifida ([place])    Urinary incontinence    From bladder cancer and radiation    Family History  Problem Relation Age of Onset   Alzheimer's disease Father     Social History   Socioeconomic History   Marital status: Married    Spouse name: Not on file   Number of children: Not on file   Years of education: Not on file   Highest education level: Not on file  Occupational History   Not on file  Tobacco Use   Smoking status: Never   Smokeless tobacco: Never  Substance and Sexual Activity   Alcohol use: Yes    Comment: rare   Drug  use: No   Sexual activity: Never  Other Topics Concern   Not on file  Social History Narrative   Not on file   Social Determinants of Health   Financial Resource Strain: Not on file  Food Insecurity: Not on file  Transportation Needs: Not on file  Physical Activity: Not on file  Stress: Not on file  Social Connections: Not on file  Intimate Partner Violence: Not on file    Past Medical History, Surgical history, Social history, and Family history were reviewed and updated as appropriate.   Please see review of systems for further details on the patient's review from today.   Objective:   Physical Exam:  BP 124/82   Physical Exam Neurological:     Mental Status: She is alert and oriented to person, place, and time.     Cranial Nerves: No dysarthria.  Psychiatric:        Attention and Perception: Attention and perception normal.        Mood and Affect: Mood is anxious and depressed.        Speech: Speech normal.        Behavior: Behavior is cooperative.        Thought Content: Thought content normal. Thought content is not paranoid or delusional. Thought content does not include homicidal or suicidal ideation. Thought content does not include homicidal or suicidal plan.        Cognition and Memory: Cognition and memory normal.        Judgment: Judgment normal.     Comments: Insight intact    Lab Review:     Component Value  Date/Time   NA 136 12/27/2016 0436   K 3.6 12/27/2016 0436   CL 100 (L) 12/27/2016 0436   CO2 24 12/27/2016 0436   GLUCOSE 92 12/27/2016 0436   BUN <5 (L) 12/27/2016 0436   CREATININE 0.56 12/27/2016 0436   CALCIUM 8.4 (L) 12/27/2016 0436   PROT 6.7 12/26/2016 1205   ALBUMIN 3.0 (L) 12/26/2016 1205   AST 15 12/26/2016 1205   ALT 15 12/26/2016 1205   ALKPHOS 98 12/26/2016 1205   BILITOT 0.1 (L) 12/26/2016 1205   GFRNONAA >60 12/27/2016 0436   GFRAA >60 12/27/2016 0436       Component Value Date/Time   WBC 12.1 (H) 12/28/2016 0503   RBC 2.96 (L) 12/28/2016 0503   HGB 9.1 (L) 12/28/2016 0503   HCT 28.7 (L) 12/28/2016 0503   PLT 404 (H) 12/28/2016 0503   MCV 97.0 12/28/2016 0503   MCH 30.7 12/28/2016 0503   MCHC 31.7 12/28/2016 0503   RDW 15.1 12/28/2016 0503   LYMPHSABS 1.5 03/25/2014 1606   MONOABS 0.7 03/25/2014 1606   EOSABS 0.2 03/25/2014 1606   BASOSABS 0.0 03/25/2014 1606    No results found for: POCLITH, LITHIUM   No results found for: PHENYTOIN, PHENOBARB, VALPROATE, CBMZ   .res Assessment: Plan:    Pt reports that she would like to try resuming some of her medications that she has not been able to recently obtain before making any medication changes. Discussed potential benefits, risks, and side effects of Abilify if mood s/s do not improve. Discussed potential metabolic side effects associated with atypical antipsychotics, as well as potential risk for movement side effects. She reports that she will consider this, look into cost of med, and will call back if she would like to start Abilify.  Continue Wellbutrin XL 300 mg po qd for depression.  Continue Buspar 30 mg  po BID for anxiety.  Continue Sertraline 200 mg po qd for anxiety and depression.  Continue Gabapentin 600 mg four times daily for anxiety. Pt reports that Gabapentin is also helpful for her pain.  Continue Alprazolam 1 mg 1/2 tab po TID and 1/2 tab prn anxiety.  Pt reports that she prefers to  follow-up as needed based upon whether s/s improve with resuming meds or if she continues to have anxiety and depression.  Patient advised to contact office with any questions, adverse effects, or acute worsening in signs and symptoms.    Lurena was seen today for anxiety, depression and insomnia.  Diagnoses and all orders for this visit:  Anxiety -     gabapentin (NEURONTIN) 600 MG tablet; Take 1 tablet (600 mg total) by mouth 4 (four) times daily. TAKE 1 TABLET BY MOUTH 4  TIMES DAILY -     sertraline (ZOLOFT) 100 MG tablet; Take 2 tablets (200 mg total) by mouth daily. -     ALPRAZolam (XANAX) 1 MG tablet; TAKE 1/2 TABLET BY MOUTH 3 TIMES A DAY AND 1/2 TABLET DAILY AS NEEDED FOR PANIC ATTACKS  Other depression -     buPROPion (WELLBUTRIN XL) 300 MG 24 hr tablet; Take 1 tablet (300 mg total) by mouth daily. -     sertraline (ZOLOFT) 100 MG tablet; Take 2 tablets (200 mg total) by mouth daily.    Please see After Visit Summary for patient specific instructions.  No future appointments.  No orders of the defined types were placed in this encounter.     -------------------------------

## 2022-04-16 ENCOUNTER — Other Ambulatory Visit: Payer: Self-pay | Admitting: Psychiatry

## 2022-04-16 DIAGNOSIS — F419 Anxiety disorder, unspecified: Secondary | ICD-10-CM

## 2022-04-26 ENCOUNTER — Other Ambulatory Visit: Payer: Self-pay | Admitting: Psychiatry

## 2022-04-26 DIAGNOSIS — F419 Anxiety disorder, unspecified: Secondary | ICD-10-CM

## 2022-04-27 NOTE — Telephone Encounter (Signed)
Received refill request for patient. Patient is overdue for follow-up. Please call to schedule follow-up appointment.  

## 2022-04-27 NOTE — Telephone Encounter (Signed)
Filled 7/21

## 2022-05-07 ENCOUNTER — Telehealth: Payer: Medicare Other | Admitting: Psychiatry

## 2022-05-11 ENCOUNTER — Telehealth: Payer: Medicare Other | Admitting: Psychiatry

## 2022-05-23 ENCOUNTER — Encounter: Payer: Self-pay | Admitting: Psychiatry

## 2022-05-23 ENCOUNTER — Ambulatory Visit (INDEPENDENT_AMBULATORY_CARE_PROVIDER_SITE_OTHER): Payer: Medicare Other | Admitting: Psychiatry

## 2022-05-23 DIAGNOSIS — F419 Anxiety disorder, unspecified: Secondary | ICD-10-CM

## 2022-05-23 DIAGNOSIS — F3289 Other specified depressive episodes: Secondary | ICD-10-CM

## 2022-05-23 MED ORDER — ALPRAZOLAM 1 MG PO TABS: ORAL_TABLET | ORAL | 5 refills | Status: DC

## 2022-05-23 MED ORDER — SERTRALINE HCL 100 MG PO TABS: 200.0000 mg | ORAL_TABLET | Freq: Every day | ORAL | 1 refills | Status: DC

## 2022-05-23 MED ORDER — GABAPENTIN 600 MG PO TABS: 600.0000 mg | ORAL_TABLET | Freq: Four times a day (QID) | ORAL | 3 refills | Status: DC

## 2022-05-23 MED ORDER — BUPROPION HCL ER (XL) 300 MG PO TB24: 300.0000 mg | ORAL_TABLET | Freq: Every day | ORAL | 1 refills | Status: DC

## 2022-05-23 MED ORDER — BUSPIRONE HCL 30 MG PO TABS: 30.0000 mg | ORAL_TABLET | Freq: Two times a day (BID) | ORAL | 3 refills | Status: DC

## 2022-05-23 NOTE — Progress Notes (Signed)
Natalie Hampton [id number] 01/09/1951 71 y.o.  Virtual Visit via Telephone Note  I connected with pt on 05/25/22 at 11:30 AM EDT by telephone and verified that I am speaking with the correct person using two identifiers.   I discussed the limitations, risks, security and privacy concerns of performing an evaluation and management service by telephone and the availability of in person appointments. I also discussed with the patient that there may be a patient responsible charge related to this service. The patient expressed understanding and agreed to proceed.   I discussed the assessment and treatment plan with the patient. The patient was provided an opportunity to ask questions and all were answered. The patient agreed with the plan and demonstrated an understanding of the instructions.   The patient was advised to call back or seek an in-person evaluation if the symptoms worsen or if the condition fails to improve as anticipated.  I provided 12 minutes of non-face-to-face time during this encounter.  The patient was located at home.  The provider was located at [place].   Thayer Headings, PMHNP   Subjective:   Patient ID:  Natalie Hampton is a 71 y.o. (DOB 04/04/1951) female.  Chief Complaint:  Chief Complaint  Patient presents with   Follow-up    Anxiety, depression    HPI Natalie Hampton presents for follow-up of anxiety and depression. She reports that her anxiety has been "up and down... some days it's really bad and some days it's good." She reports that her anxiety is consistent with baseline. She reports that she has had some anxiety in response to waking up with a hoarse voice. She reports some anxiety about getting sick. She reports some anxiety when it starts getting dark and she is home by herself. She reports that she may have had a couple of panic attacks that she was able to talk herself through. She reports that her depression "ebbs and flows" and may increase when she thinks  about lost loved ones. Sleeping well. Energy and motivation "could be better" and are affected by fibromyalgia. She reports that concentration has been adequate and will work puzzles daily. She reports that her appetite is "alright" and will eat when hungry. Denies SI.    Past Psychiatric Medication Trials: Gabapentin- Reports that this has been helpful for her neuropathy. Sertraline Paxil Effexor Cymbalta Prozac Buspar  Wellbutrin XL Xanax Trazodone  Review of Systems:  Review of Systems  HENT:  Positive for voice change.   Musculoskeletal:  Positive for arthralgias, gait problem and myalgias.  Psychiatric/Behavioral:         Please refer to HPI    Medications: I have reviewed the patient's current medications.  Current Outpatient Medications  Medication Sig Dispense Refill   ALPRAZolam (XANAX) 1 MG tablet TAKE 1/2 TABLET BY MOUTH 3 TIMES A DAY AND 1/2 TABLET DAILY AS NEEDED FOR PANIC ATTACKS 60 tablet 5   buPROPion (WELLBUTRIN XL) 300 MG 24 hr tablet Take 1 tablet (300 mg total) by mouth daily. 90 tablet 1   busPIRone (BUSPAR) 30 MG tablet Take 1 tablet (30 mg total) by mouth 2 (two) times daily. 180 tablet 3   diphenoxylate-atropine (LOMOTIL) 2.5-0.025 MG tablet Lomotil 2.5 mg-0.025 mg tablet  Take 2 tablets 4 times a day by oral route as needed.     gabapentin (NEURONTIN) 600 MG tablet Take 1 tablet (600 mg total) by mouth 4 (four) times daily. 360 tablet 3   HYDROcodone-acetaminophen (NORCO) 10-325 MG tablet Take 1 tablet  by mouth every 6 (six) hours as needed.      levothyroxine (SYNTHROID, LEVOTHROID) 50 MCG tablet Take 50 mcg by mouth daily.      Loperamide HCl (IMODIUM PO) Take by mouth.     Melatonin 10 MG TABS Take by mouth.     Multiple Vitamin (MULTIVITAMIN WITH MINERALS) TABS tablet Take 1 tablet by mouth daily.     omeprazole (PRILOSEC) 40 MG capsule Take 40 mg by mouth daily.      sertraline (ZOLOFT) 100 MG tablet Take 2 tablets (200 mg total) by mouth daily.  180 tablet 1   Tetrahydrozoline HCl (VISINE OP) Place 1 drop into both eyes daily as needed (dry eyes).     tiZANidine (ZANAFLEX) 2 MG tablet      No current facility-administered medications for this visit.    Medication Side Effects: None  Allergies:  Allergies  Allergen Reactions   Penicillins Anaphylaxis   Sulfa Antibiotics Hives   Vancomycin Diarrhea    Past Medical History:  Diagnosis Date   Anxiety    Arthritis    Bladder cancer (HCC) 20 years ago   Chronic fatigue    Depression    Fibromyalgia    GERD (gastroesophageal reflux disease)    History of hiatal hernia    Hypothyroidism    IBS (irritable bowel syndrome)    PONV (postoperative nausea and vomiting)    Spina bifida ([place])    Urinary incontinence    From bladder cancer and radiation    Family History  Problem Relation Age of Onset   Alzheimer's disease Father     Social History   Socioeconomic History   Marital status: Married    Spouse name: Not on file   Number of children: Not on file   Years of education: Not on file   Highest education level: Not on file  Occupational History   Not on file  Tobacco Use   Smoking status: Never   Smokeless tobacco: Never  Substance and Sexual Activity   Alcohol use: Yes    Comment: rare   Drug use: No   Sexual activity: Never  Other Topics Concern   Not on file  Social History Narrative   Not on file   Social Determinants of Health   Financial Resource Strain: Not on file  Food Insecurity: Not on file  Transportation Needs: Not on file  Physical Activity: Not on file  Stress: Not on file  Social Connections: Not on file  Intimate Partner Violence: Not on file    Past Medical History, Surgical history, Social history, and Family history were reviewed and updated as appropriate.   Please see review of systems for further details on the patient's review from today.   Objective:   Physical Exam:  There were no vitals taken for this  visit.  Physical Exam Neurological:     Mental Status: She is alert and oriented to person, place, and time.     Cranial Nerves: No dysarthria.  Psychiatric:        Attention and Perception: Attention and perception normal.        Mood and Affect: Mood normal.        Speech: Speech normal.        Behavior: Behavior is cooperative.        Thought Content: Thought content normal. Thought content is not paranoid or delusional. Thought content does not include homicidal or suicidal ideation. Thought content does not include homicidal or suicidal plan.  Cognition and Memory: Cognition and memory normal.        Judgment: Judgment normal.     Comments: Insight intact     Lab Review:     Component Value Date/Time   NA 136 12/27/2016 0436   K 3.6 12/27/2016 0436   CL 100 (L) 12/27/2016 0436   CO2 24 12/27/2016 0436   GLUCOSE 92 12/27/2016 0436   BUN <5 (L) 12/27/2016 0436   CREATININE 0.56 12/27/2016 0436   CALCIUM 8.4 (L) 12/27/2016 0436   PROT 6.7 12/26/2016 1205   ALBUMIN 3.0 (L) 12/26/2016 1205   AST 15 12/26/2016 1205   ALT 15 12/26/2016 1205   ALKPHOS 98 12/26/2016 1205   BILITOT 0.1 (L) 12/26/2016 1205   GFRNONAA >60 12/27/2016 0436   GFRAA >60 12/27/2016 0436       Component Value Date/Time   WBC 12.1 (H) 12/28/2016 0503   RBC 2.96 (L) 12/28/2016 0503   HGB 9.1 (L) 12/28/2016 0503   HCT 28.7 (L) 12/28/2016 0503   PLT 404 (H) 12/28/2016 0503   MCV 97.0 12/28/2016 0503   MCH 30.7 12/28/2016 0503   MCHC 31.7 12/28/2016 0503   RDW 15.1 12/28/2016 0503   LYMPHSABS 1.5 03/25/2014 1606   MONOABS 0.7 03/25/2014 1606   EOSABS 0.2 03/25/2014 1606   BASOSABS 0.0 03/25/2014 1606    No results found for: "POCLITH", "LITHIUM"   No results found for: "PHENYTOIN", "PHENOBARB", "VALPROATE", "CBMZ"   .res Assessment: Plan:   Will continue current plan of care since target signs and symptoms are well controlled without any tolerability issues. Pt to follow-up in 6  months or sooner if clinically indicated.  Patient advised to contact office with any questions, adverse effects, or acute worsening in signs and symptoms.  Sherle was seen today for follow-up.  Diagnoses and all orders for this visit:  Anxiety -     busPIRone (BUSPAR) 30 MG tablet; Take 1 tablet (30 mg total) by mouth 2 (two) times daily. -     ALPRAZolam (XANAX) 1 MG tablet; TAKE 1/2 TABLET BY MOUTH 3 TIMES A DAY AND 1/2 TABLET DAILY AS NEEDED FOR PANIC ATTACKS -     gabapentin (NEURONTIN) 600 MG tablet; Take 1 tablet (600 mg total) by mouth 4 (four) times daily. -     sertraline (ZOLOFT) 100 MG tablet; Take 2 tablets (200 mg total) by mouth daily.  Other depression -     buPROPion (WELLBUTRIN XL) 300 MG 24 hr tablet; Take 1 tablet (300 mg total) by mouth daily. -     sertraline (ZOLOFT) 100 MG tablet; Take 2 tablets (200 mg total) by mouth daily.    Please see After Visit Summary for patient specific instructions.  Future Appointments  Date Time Provider [place]  11/23/2022 11:00 AM Thayer Headings, PMHNP CP-CP None    No orders of the defined types were placed in this encounter.     -------------------------------

## 2022-11-07 ENCOUNTER — Other Ambulatory Visit: Payer: Self-pay | Admitting: Psychiatry

## 2022-11-07 DIAGNOSIS — F419 Anxiety disorder, unspecified: Secondary | ICD-10-CM

## 2022-11-23 ENCOUNTER — Ambulatory Visit: Payer: Medicare Other | Admitting: Psychiatry

## 2022-11-23 DIAGNOSIS — Z91199 Patient's noncompliance with other medical treatment and regimen due to unspecified reason: Secondary | ICD-10-CM

## 2022-11-23 NOTE — Progress Notes (Signed)
Natalie Hampton [id number] [birth year] 72 y.o.  Virtual Visit via Telephone Note  Attempted to reach patient by phone x 2.  No answer.  Voicemail is full and unable to leave a message.

## 2022-11-27 ENCOUNTER — Ambulatory Visit (INDEPENDENT_AMBULATORY_CARE_PROVIDER_SITE_OTHER): Payer: Self-pay | Admitting: Psychiatry

## 2022-11-27 DIAGNOSIS — Z91199 Patient's noncompliance with other medical treatment and regimen due to unspecified reason: Secondary | ICD-10-CM

## 2022-11-27 NOTE — Progress Notes (Unsigned)
Natalie Hampton [id number] 02/13/1951 72 y.o.  Virtual Visit via Telephone Note  Attempted to reach pt x 2 by telephone. No answer.

## 2022-11-28 ENCOUNTER — Encounter: Payer: Self-pay | Admitting: Psychiatry

## 2022-11-28 ENCOUNTER — Ambulatory Visit (INDEPENDENT_AMBULATORY_CARE_PROVIDER_SITE_OTHER): Payer: Medicare Other | Admitting: Psychiatry

## 2022-11-28 DIAGNOSIS — F419 Anxiety disorder, unspecified: Secondary | ICD-10-CM | POA: Diagnosis not present

## 2022-11-28 DIAGNOSIS — F3289 Other specified depressive episodes: Secondary | ICD-10-CM | POA: Diagnosis not present

## 2022-11-28 MED ORDER — GABAPENTIN 600 MG PO TABS: 600.0000 mg | ORAL_TABLET | Freq: Four times a day (QID) | ORAL | 3 refills | Status: DC

## 2022-11-28 MED ORDER — BUPROPION HCL ER (XL) 300 MG PO TB24: 300.0000 mg | ORAL_TABLET | Freq: Every day | ORAL | 1 refills | Status: DC

## 2022-11-28 MED ORDER — SERTRALINE HCL 100 MG PO TABS: 200.0000 mg | ORAL_TABLET | Freq: Every day | ORAL | 1 refills | Status: DC

## 2022-11-28 MED ORDER — BUSPIRONE HCL 30 MG PO TABS: 30.0000 mg | ORAL_TABLET | Freq: Two times a day (BID) | ORAL | 3 refills | Status: DC

## 2022-11-28 NOTE — Progress Notes (Signed)
Natalie Hampton [id number] Sep 06, 1951 72 y.o.  Virtual Visit via Telephone Note  I connected with pt on 11/28/22 at 11:00 AM EDT by telephone and verified that I am speaking with the correct person using two identifiers.   I discussed the limitations, risks, security and privacy concerns of performing an evaluation and management service by telephone and the availability of in person appointments. I also discussed with the patient that there may be a patient responsible charge related to this service. The patient expressed understanding and agreed to proceed.   I discussed the assessment and treatment plan with the patient. The patient was provided an opportunity to ask questions and all were answered. The patient agreed with the plan and demonstrated an understanding of the instructions.   The patient was advised to call back or seek an in-person evaluation if the symptoms worsen or if the condition fails to improve as anticipated.  I provided 17 minutes of non-face-to-face time during this encounter.  The patient was located at home.  The provider was located at [place].   Thayer Headings, PMHNP   Subjective:   Patient ID:  Natalie Hampton is a 72 y.o. (DOB 08/25/1951) female.  Chief Complaint:  Chief Complaint  Patient presents with   Follow-up    Anxiety, insomnia, depression    HPI Natalie Hampton presents for follow-up of anxiety and depression. She reports, "I'm doing ok." She reports that she has some family members helping her. She reports that the early part of March was difficult for her since those were the birthdays of 2 deceased relatives. She reports that she has not had severe depression- "it's about the same level." She reports that she has pain frequently. She reports frustration in response to not being able to do things she wants to do. She reports that she has some anxiety about being alone during the day. She reports some catastrophic thoughts. She reports occ panic attacks.  She reports altered sleep wake cycle- "I stay up late, and I sleep late." She reports that she does not leave the house except to see pain management specialist. Enjoys playing with her dog, reading, and playing with her dog. She reports that concentration is ok "most of the time." She reports occ word finding difficulties. She reports some occasional forgetfulness. Appetite has not been very good. She reports that she does not eat much and is not interested in eating. She reports that she drinks protein drinks. She reports that she often heats up frozen food in her microwave. Denies SI.   She reports that family helps her- sister-in-law comes a few times a week to help some household chores and dishes. Her nephew takes her trash out for her. They live nearby.   Alprazolam last filled 11/09/22.   Review of Systems:  Review of Systems  Gastrointestinal:  Positive for diarrhea.  Musculoskeletal:  Positive for arthralgias, back pain and gait problem.       Uses a motorized wheelchair  Allergic/Immunologic: Positive for environmental allergies.  Psychiatric/Behavioral:         Please refer to HPI    Medications: I have reviewed the patient's current medications.  Current Outpatient Medications  Medication Sig Dispense Refill   ALPRAZolam (XANAX) 1 MG tablet TAKE 1/2 TABLET BY MOUTH 3 TIMES A DAY AND 1/2 TABLET DAILY AS NEEDED FOR PANIC ATTACKS 60 tablet 5   HYDROcodone-acetaminophen (NORCO) 10-325 MG tablet Take 1 tablet by mouth every 6 (six) hours as needed.  Loperamide HCl (IMODIUM PO) Take by mouth.     Melatonin 10 MG TABS Take by mouth.     Multiple Vitamin (MULTIVITAMIN WITH MINERALS) TABS tablet Take 1 tablet by mouth daily.     omeprazole (PRILOSEC) 40 MG capsule Take 40 mg by mouth daily.      Tetrahydrozoline HCl (VISINE OP) Place 1 drop into both eyes daily as needed (dry eyes).     tiZANidine (ZANAFLEX) 2 MG tablet      buPROPion (WELLBUTRIN XL) 300 MG 24 hr tablet Take 1  tablet (300 mg total) by mouth daily. 90 tablet 1   busPIRone (BUSPAR) 30 MG tablet Take 1 tablet (30 mg total) by mouth 2 (two) times daily. 180 tablet 3   diphenoxylate-atropine (LOMOTIL) 2.5-0.025 MG tablet Lomotil 2.5 mg-0.025 mg tablet  Take 2 tablets 4 times a day by oral route as needed. (Patient not taking: Reported on 11/28/2022)     gabapentin (NEURONTIN) 600 MG tablet Take 1 tablet (600 mg total) by mouth 4 (four) times daily. 360 tablet 3   sertraline (ZOLOFT) 100 MG tablet Take 2 tablets (200 mg total) by mouth daily. 180 tablet 1   No current facility-administered medications for this visit.    Medication Side Effects: None  Allergies:  Allergies  Allergen Reactions   Penicillins Anaphylaxis   Sulfa Antibiotics Hives   Vancomycin Diarrhea    Past Medical History:  Diagnosis Date   Anxiety    Arthritis    Bladder cancer (HCC) 20 years ago   Chronic fatigue    Depression    Fibromyalgia    GERD (gastroesophageal reflux disease)    History of hiatal hernia    Hypothyroidism    IBS (irritable bowel syndrome)    PONV (postoperative nausea and vomiting)    Spina bifida ([place])    Urinary incontinence    From bladder cancer and radiation    Family History  Problem Relation Age of Onset   Alzheimer's disease Father     Social History   Socioeconomic History   Marital status: Married    Spouse name: Not on file   Number of children: Not on file   Years of education: Not on file   Highest education level: Not on file  Occupational History   Not on file  Tobacco Use   Smoking status: Never   Smokeless tobacco: Never  Substance and Sexual Activity   Alcohol use: Yes    Comment: rare   Drug use: No   Sexual activity: Never  Other Topics Concern   Not on file  Social History Narrative   Not on file   Social Determinants of Health   Financial Resource Strain: Not on file  Food Insecurity: Not on file  Transportation Needs: Not on file  Physical  Activity: Not on file  Stress: Not on file  Social Connections: Not on file  Intimate Partner Violence: Not on file    Past Medical History, Surgical history, Social history, and Family history were reviewed and updated as appropriate.   Please see review of systems for further details on the patient's review from today.   Objective:   Physical Exam:  There were no vitals taken for this visit.  Physical Exam Constitutional:      General: She is not in acute distress. Neurological:     Mental Status: She is alert and oriented to person, place, and time.     Coordination: Coordination normal.  Psychiatric:  Attention and Perception: Attention and perception normal. She does not perceive auditory or visual hallucinations.        Mood and Affect: Mood is anxious. Mood is not depressed. Affect is not labile, blunt, angry or inappropriate.        Speech: Speech normal.        Behavior: Behavior normal. Behavior is cooperative.        Thought Content: Thought content normal. Thought content is not paranoid or delusional. Thought content does not include homicidal or suicidal ideation. Thought content does not include homicidal or suicidal plan.        Cognition and Memory: Cognition and memory normal.        Judgment: Judgment normal.     Comments: Insight intact     Lab Review:     Component Value Date/Time   NA 136 12/27/2016 0436   K 3.6 12/27/2016 0436   CL 100 (L) 12/27/2016 0436   CO2 24 12/27/2016 0436   GLUCOSE 92 12/27/2016 0436   BUN <5 (L) 12/27/2016 0436   CREATININE 0.56 12/27/2016 0436   CALCIUM 8.4 (L) 12/27/2016 0436   PROT 6.7 12/26/2016 1205   ALBUMIN 3.0 (L) 12/26/2016 1205   AST 15 12/26/2016 1205   ALT 15 12/26/2016 1205   ALKPHOS 98 12/26/2016 1205   BILITOT 0.1 (L) 12/26/2016 1205   GFRNONAA >60 12/27/2016 0436   GFRAA >60 12/27/2016 0436       Component Value Date/Time   WBC 12.1 (H) 12/28/2016 0503   RBC 2.96 (L) 12/28/2016 0503   HGB  9.1 (L) 12/28/2016 0503   HCT 28.7 (L) 12/28/2016 0503   PLT 404 (H) 12/28/2016 0503   MCV 97.0 12/28/2016 0503   MCH 30.7 12/28/2016 0503   MCHC 31.7 12/28/2016 0503   RDW 15.1 12/28/2016 0503   LYMPHSABS 1.5 03/25/2014 1606   MONOABS 0.7 03/25/2014 1606   EOSABS 0.2 03/25/2014 1606   BASOSABS 0.0 03/25/2014 1606    No results found for: "POCLITH", "LITHIUM"   No results found for: "PHENYTOIN", "PHENOBARB", "VALPROATE", "CBMZ"   .res Assessment: Plan:   Will continue current plan of care since target signs and symptoms are well controlled without any tolerability issues. Continue Buspar 30 mg po BID for anxiety.  Continue Sertraline 200 mg po qd for anxiety and depression.  Continue Wellbutrin XL 300 mg po qd for depression.  Continue Xanax 1 mg 1/2 tab TID and 1/2 tab daily as needed for panic.  Pt to follow-up in 6 months or sooner if clinically indicated.  Patient advised to contact office with any questions, adverse effects, or acute worsening in signs and symptoms.   Natalie Hampton was seen today for follow-up.  Diagnoses and all orders for this visit:  Anxiety -     busPIRone (BUSPAR) 30 MG tablet; Take 1 tablet (30 mg total) by mouth 2 (two) times daily. -     gabapentin (NEURONTIN) 600 MG tablet; Take 1 tablet (600 mg total) by mouth 4 (four) times daily. -     sertraline (ZOLOFT) 100 MG tablet; Take 2 tablets (200 mg total) by mouth daily.  Other depression -     buPROPion (WELLBUTRIN XL) 300 MG 24 hr tablet; Take 1 tablet (300 mg total) by mouth daily. -     sertraline (ZOLOFT) 100 MG tablet; Take 2 tablets (200 mg total) by mouth daily.    Please see After Visit Summary for patient specific instructions.  No future appointments.  No  orders of the defined types were placed in this encounter.     -------------------------------

## 2023-04-24 ENCOUNTER — Other Ambulatory Visit: Payer: Self-pay | Admitting: Psychiatry

## 2023-04-24 DIAGNOSIS — F419 Anxiety disorder, unspecified: Secondary | ICD-10-CM

## 2023-04-24 NOTE — Telephone Encounter (Signed)
Due 8/16.

## 2023-04-29 ENCOUNTER — Ambulatory Visit (INDEPENDENT_AMBULATORY_CARE_PROVIDER_SITE_OTHER): Payer: Medicare Other | Admitting: Psychiatry

## 2023-04-29 ENCOUNTER — Encounter: Payer: Self-pay | Admitting: Psychiatry

## 2023-04-29 DIAGNOSIS — F3289 Other specified depressive episodes: Secondary | ICD-10-CM | POA: Diagnosis not present

## 2023-04-29 DIAGNOSIS — F419 Anxiety disorder, unspecified: Secondary | ICD-10-CM | POA: Diagnosis not present

## 2023-04-29 MED ORDER — SERTRALINE HCL 100 MG PO TABS
200.0000 mg | ORAL_TABLET | Freq: Every day | ORAL | 1 refills | Status: DC
Start: 2023-04-29 — End: 2023-09-09

## 2023-04-29 MED ORDER — BUPROPION HCL ER (XL) 300 MG PO TB24
300.0000 mg | ORAL_TABLET | Freq: Every day | ORAL | 1 refills | Status: DC
Start: 2023-04-29 — End: 2023-09-09

## 2023-04-29 MED ORDER — ALPRAZOLAM 1 MG PO TABS
ORAL_TABLET | ORAL | 5 refills | Status: AC
Start: 2023-05-24 — End: ?

## 2023-04-29 NOTE — Progress Notes (Unsigned)
Natalie Hampton [id number] 03/04/1951 72 y.o.  Virtual Visit via Telephone Note  I connected with pt on 04/29/23 at  3:00 PM EDT by telephone and verified that I am speaking with the correct person using two identifiers.   I discussed the limitations, risks, security and privacy concerns of performing an evaluation and management service by telephone and the availability of in person appointments. I also discussed with the patient that there may be a patient responsible charge related to this service. The patient expressed understanding and agreed to proceed.   I discussed the assessment and treatment plan with the patient. The patient was provided an opportunity to ask questions and all were answered. The patient agreed with the plan and demonstrated an understanding of the instructions.   The patient was advised to call back or seek an in-person evaluation if the symptoms worsen or if the condition fails to improve as anticipated.  I provided 42 minutes of non-face-to-face time during this encounter.  The patient was located at home.  The provider was located at home.   Corie Chiquito, PMHNP   Subjective:   Patient ID:  Natalie Hampton is a 72 y.o. (DOB [birth year]) female.  Chief Complaint:  Chief Complaint  Patient presents with   Follow-up    Depression and anxiety    HPI Natalie Hampton presents for follow-up of depression and anxiety. She reports that her nephew and sister-in-law have been busy with work demands and she has anxiety about no one being there if she were to have an emergency. "I'm out here in the middle of nowhere with nobody." She reports that she had anxiety when fire department advised her to call 911 if she fell and was able to get back up or if she had an emergency. She reports catastrophic thoughts and tends to think about worst case scenarios. She reports worry about finances and the future. She reports that she recently turned 72 years old and "I feel old... I feel like I have been  left behind... I feel like a burden." She reports that she has been experiencing depression and "the more depressed I get, the more anxious I get." She reports occasional palpitations and panic attacks and reports that this has been lifelong since first grade. She reports having about 3-4 panic attacks a week and sometimes more than one in a day. She reports altered sleep-wake cycle. She reports adequate sleep. She reports that her energy and motivation are fair. She reports that she is keeping up with light housework that she is physically able to do. She reports that she "will forget stuff," and has difficulty recalling names. Appetite varies and goes "back and forth." Denies SI.   She reports that she has a primary care provider that does home visits.   She reports limited social interaction. She reports that her 59 yo dog is a source of support for her.   Alprazolam was last filled 04/26/23. Gabapentin was last filled 04/08/23.  Review of Systems:  Review of Systems  Cardiovascular:        She reports increased heart rate with panic attacks.  Musculoskeletal:  Positive for gait problem.  Psychiatric/Behavioral:         Please refer to HPI    Medications: I have reviewed the patient's current medications.  Current Outpatient Medications  Medication Sig Dispense Refill   busPIRone (BUSPAR) 30 MG tablet Take 1 tablet (30 mg total) by mouth 2 (two) times daily. 180 tablet 3   [START  ON 05/24/2023] ALPRAZolam (XANAX) 1 MG tablet TAKE 1/2 TABLET BY MOUTH 3 TIMES A DAY AND 1/2 TABLET DAILY AS NEEDED FOR PANIC ATTACKS 60 tablet 5   buPROPion (WELLBUTRIN XL) 300 MG 24 hr tablet Take 1 tablet (300 mg total) by mouth daily. 90 tablet 1   diphenoxylate-atropine (LOMOTIL) 2.5-0.025 MG tablet Lomotil 2.5 mg-0.025 mg tablet  Take 2 tablets 4 times a day by oral route as needed. (Patient not taking: Reported on 11/28/2022)     gabapentin (NEURONTIN) 600 MG tablet Take 1 tablet (600 mg total) by mouth 4  (four) times daily. 360 tablet 3   HYDROcodone-acetaminophen (NORCO) 10-325 MG tablet Take 1 tablet by mouth every 6 (six) hours as needed.      Loperamide HCl (IMODIUM PO) Take by mouth.     Melatonin 10 MG TABS Take by mouth.     Multiple Vitamin (MULTIVITAMIN WITH MINERALS) TABS tablet Take 1 tablet by mouth daily.     omeprazole (PRILOSEC) 40 MG capsule Take 40 mg by mouth daily.      sertraline (ZOLOFT) 100 MG tablet Take 2 tablets (200 mg total) by mouth daily. 180 tablet 1   Tetrahydrozoline HCl (VISINE OP) Place 1 drop into both eyes daily as needed (dry eyes).     tiZANidine (ZANAFLEX) 2 MG tablet      No current facility-administered medications for this visit.    Medication Side Effects: None  Allergies:  Allergies  Allergen Reactions   Penicillins Anaphylaxis   Sulfa Antibiotics Hives   Vancomycin Diarrhea    Past Medical History:  Diagnosis Date   Anxiety    Arthritis    Bladder cancer (HCC) 20 years ago   Chronic fatigue    Depression    Fibromyalgia    GERD (gastroesophageal reflux disease)    History of hiatal hernia    Hypothyroidism    IBS (irritable bowel syndrome)    PONV (postoperative nausea and vomiting)    Spina bifida (HCC)    Urinary incontinence    From bladder cancer and radiation    Family History  Problem Relation Age of Onset   Alzheimer's disease Father     Social History   Socioeconomic History   Marital status: Married    Spouse name: Not on file   Number of children: Not on file   Years of education: Not on file   Highest education level: Not on file  Occupational History   Not on file  Tobacco Use   Smoking status: Never   Smokeless tobacco: Never  Substance and Sexual Activity   Alcohol use: Yes    Comment: rare   Drug use: No   Sexual activity: Never  Other Topics Concern   Not on file  Social History Narrative   Not on file   Social Determinants of Health   Financial Resource Strain: Not on file  Food  Insecurity: Not on file  Transportation Needs: Not on file  Physical Activity: Not on file  Stress: Not on file  Social Connections: Not on file  Intimate Partner Violence: Not on file    Past Medical History, Surgical history, Social history, and Family history were reviewed and updated as appropriate.   Please see review of systems for further details on the patient's review from today.   Objective:   Physical Exam:  There were no vitals taken for this visit.  Physical Exam Neurological:     Mental Status: She is alert and oriented to  person, place, and time.     Cranial Nerves: No dysarthria.  Psychiatric:        Attention and Perception: Attention and perception normal.        Mood and Affect: Mood is anxious and depressed.        Speech: Speech normal.        Behavior: Behavior is cooperative.        Thought Content: Thought content normal. Thought content is not paranoid or delusional. Thought content does not include homicidal or suicidal ideation. Thought content does not include homicidal or suicidal plan.        Cognition and Memory: Cognition and memory normal.        Judgment: Judgment normal.     Comments: Insight intact     Lab Review:     Component Value Date/Time   NA 136 12/27/2016 0436   K 3.6 12/27/2016 0436   CL 100 (L) 12/27/2016 0436   CO2 24 12/27/2016 0436   GLUCOSE 92 12/27/2016 0436   BUN <5 (L) 12/27/2016 0436   CREATININE 0.56 12/27/2016 0436   CALCIUM 8.4 (L) 12/27/2016 0436   PROT 6.7 12/26/2016 1205   ALBUMIN 3.0 (L) 12/26/2016 1205   AST 15 12/26/2016 1205   ALT 15 12/26/2016 1205   ALKPHOS 98 12/26/2016 1205   BILITOT 0.1 (L) 12/26/2016 1205   GFRNONAA >60 12/27/2016 0436   GFRAA >60 12/27/2016 0436       Component Value Date/Time   WBC 12.1 (H) 12/28/2016 0503   RBC 2.96 (L) 12/28/2016 0503   HGB 9.1 (L) 12/28/2016 0503   HCT 28.7 (L) 12/28/2016 0503   PLT 404 (H) 12/28/2016 0503   MCV 97.0 12/28/2016 0503   MCH 30.7  12/28/2016 0503   MCHC 31.7 12/28/2016 0503   RDW 15.1 12/28/2016 0503   LYMPHSABS 1.5 03/25/2014 1606   MONOABS 0.7 03/25/2014 1606   EOSABS 0.2 03/25/2014 1606   BASOSABS 0.0 03/25/2014 1606    No results found for: "POCLITH", "LITHIUM"   No results found for: "PHENYTOIN", "PHENOBARB", "VALPROATE", "CBMZ"   .res Assessment: Plan:    45 minutes spent dedicated to the care of this patient on the date of this encounter to include pre-visit review of records, ordering of medication, post visit documentation, and face-to-face time with the patient discussing Buspar and treatment options, as well as helping her re-frame some recent conversations that have caused anxiety. Pt reports that she is unsure if Buspar has been helpful for her anxiety. Discussed that she is at the maximum dose. Discussed option of decreasing Buspar dose by half to determine if Buspar is helping with anxiety and considering discontinuation of Buspar in the future if she does not observe any change in her anxiety symptoms with lower dose. She reports that she would like to continue Buspar without changes "in case it is helping more than I realize" and does not want to risk worsening anxiety symptoms.  Will continue Buspar 30 mg po BID for anxiety.  Continue Wellbutrin XL 300 mg daily for depression.  Continue Gabapentin 600 mg four times daily for anxiety.  Continue Sertraline 200 mg daily for anxiety and depression.  Pt to follow-up in 6 months or sooner if clinically indicated.  Patient advised to contact office with any questions, adverse effects, or acute worsening in signs and symptoms.  Sion was seen today for follow-up.  Diagnoses and all orders for this visit:  Anxiety -     ALPRAZolam Prudy Feeler)  1 MG tablet; TAKE 1/2 TABLET BY MOUTH 3 TIMES A DAY AND 1/2 TABLET DAILY AS NEEDED FOR PANIC ATTACKS -     sertraline (ZOLOFT) 100 MG tablet; Take 2 tablets (200 mg total) by mouth daily.  Other depression -      buPROPion (WELLBUTRIN XL) 300 MG 24 hr tablet; Take 1 tablet (300 mg total) by mouth daily. -     sertraline (ZOLOFT) 100 MG tablet; Take 2 tablets (200 mg total) by mouth daily.    Please see After Visit Summary for patient specific instructions.  No future appointments.   No orders of the defined types were placed in this encounter.     -------------------------------

## 2023-07-24 ENCOUNTER — Encounter: Payer: Self-pay | Admitting: Psychiatry

## 2023-09-09 ENCOUNTER — Other Ambulatory Visit: Payer: Self-pay | Admitting: Psychiatry

## 2023-09-09 DIAGNOSIS — F3289 Other specified depressive episodes: Secondary | ICD-10-CM

## 2023-09-09 DIAGNOSIS — F419 Anxiety disorder, unspecified: Secondary | ICD-10-CM

## 2023-09-15 ENCOUNTER — Other Ambulatory Visit: Payer: Self-pay | Admitting: Psychiatry

## 2023-09-15 DIAGNOSIS — F419 Anxiety disorder, unspecified: Secondary | ICD-10-CM

## 2023-10-31 ENCOUNTER — Telehealth: Payer: Medicare Other | Admitting: Psychiatry

## 2023-11-09 DEATH — deceased
